# Patient Record
Sex: Female | Born: 1970 | Race: Black or African American | Hispanic: No | Marital: Single | Smoking: Never smoker
Health system: Southern US, Community
[De-identification: ages and names within clinical notes are randomized; demographics above are authoritative.]

## PROBLEM LIST (undated history)

## (undated) DIAGNOSIS — D219 Benign neoplasm of connective and other soft tissue, unspecified: Secondary | ICD-10-CM

## (undated) DIAGNOSIS — T7840XA Allergy, unspecified, initial encounter: Secondary | ICD-10-CM

## (undated) DIAGNOSIS — D649 Anemia, unspecified: Secondary | ICD-10-CM

## (undated) HISTORY — DX: Anemia, unspecified: D64.9

## (undated) HISTORY — PX: COLONOSCOPY: SHX174

## (undated) HISTORY — PX: TONSILLECTOMY: SUR1361

## (undated) HISTORY — DX: Allergy, unspecified, initial encounter: T78.40XA

---

## 2007-09-13 ENCOUNTER — Ambulatory Visit (HOSPITAL_COMMUNITY): Admission: RE | Admit: 2007-09-13 | Discharge: 2007-09-13 | Payer: Self-pay | Admitting: Family Medicine

## 2007-10-29 ENCOUNTER — Inpatient Hospital Stay (HOSPITAL_COMMUNITY): Admission: AD | Admit: 2007-10-29 | Discharge: 2007-10-31 | Payer: Self-pay | Admitting: Obstetrics & Gynecology

## 2007-10-29 ENCOUNTER — Encounter (INDEPENDENT_AMBULATORY_CARE_PROVIDER_SITE_OTHER): Payer: Self-pay | Admitting: Emergency Medicine

## 2007-10-31 ENCOUNTER — Encounter (INDEPENDENT_AMBULATORY_CARE_PROVIDER_SITE_OTHER): Payer: Self-pay | Admitting: Obstetrics and Gynecology

## 2008-01-24 ENCOUNTER — Emergency Department (HOSPITAL_COMMUNITY): Admission: EM | Admit: 2008-01-24 | Discharge: 2008-01-24 | Payer: Self-pay | Admitting: Emergency Medicine

## 2008-05-05 ENCOUNTER — Inpatient Hospital Stay (HOSPITAL_COMMUNITY): Admission: AD | Admit: 2008-05-05 | Discharge: 2008-05-08 | Payer: Self-pay | Admitting: Obstetrics and Gynecology

## 2008-09-14 ENCOUNTER — Inpatient Hospital Stay (HOSPITAL_COMMUNITY): Admission: AD | Admit: 2008-09-14 | Discharge: 2008-09-16 | Payer: Self-pay | Admitting: Obstetrics and Gynecology

## 2009-09-29 IMAGING — US US PELVIS COMPLETE
1 series · 13 of 25 positions shown · non-contrast
Comparison: OB ultrasound 09/13/2007

CLINICAL DATA: Half from the liver and.  Bleeding/evaluate for
retained products of conception or for retained placenta.

TRANSABDOMINAL ULTRASOUND OF PELVIS
TECHNIQUE: Transabdominal ultrasound examination of the pelvis was
performed including evaluation of the uterus, ovaries, adnexal
regions, and pelvic cul-de-sac.

[Series 1: unknown · 0.35mm/px · 13 of 42 slices shown]
[im 1/42]
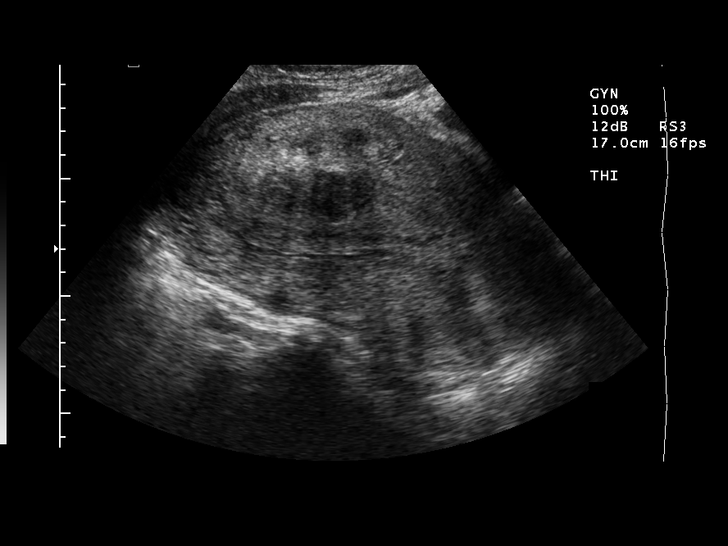
[im 4/42]
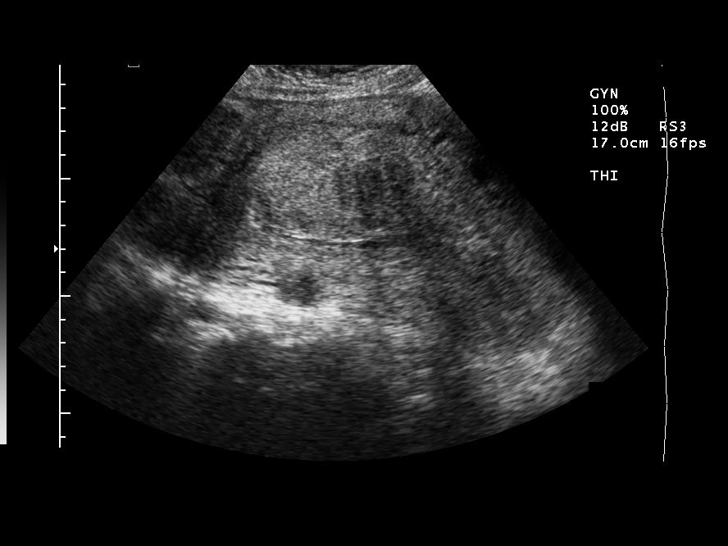
[im 7/42]
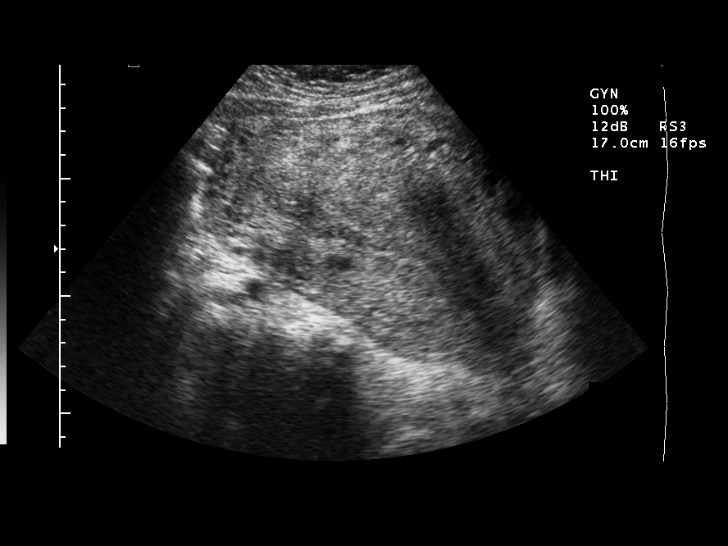
[im 11/42]
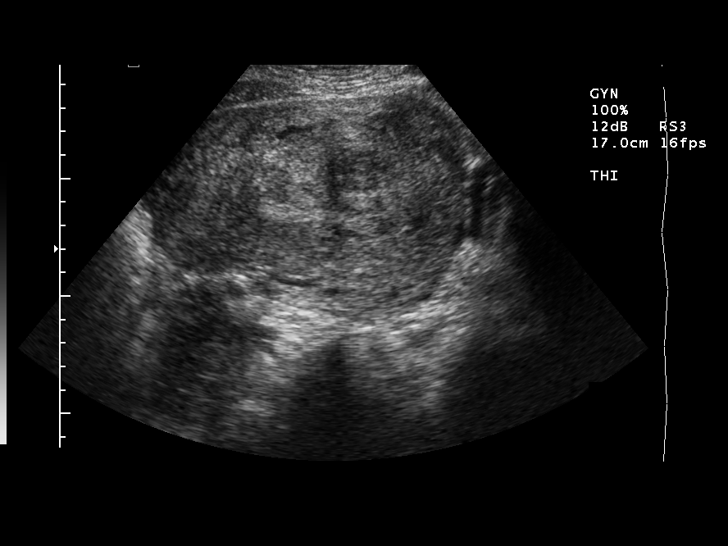
[im 14/42]
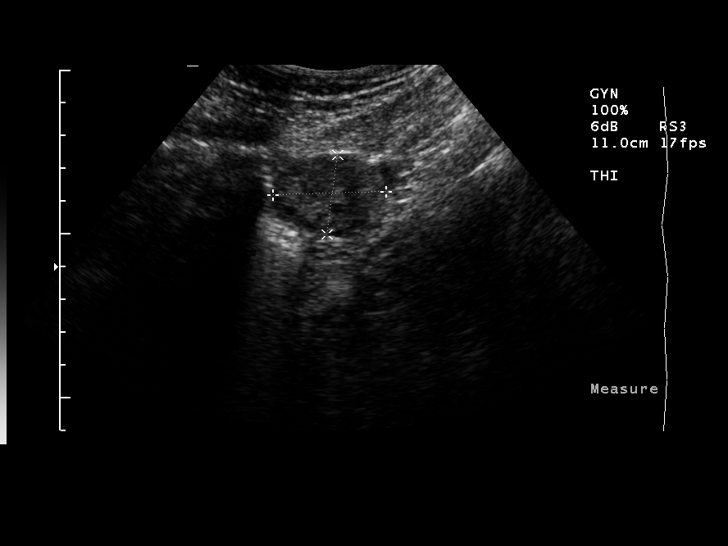
[im 18/42]
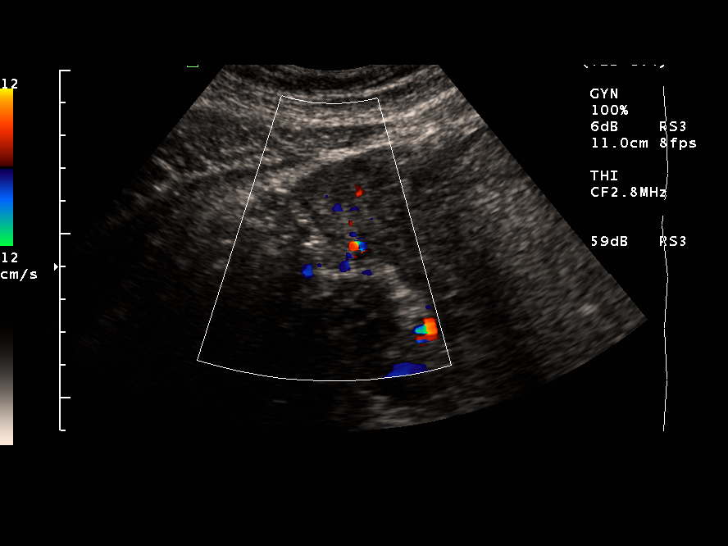
[im 21/42]
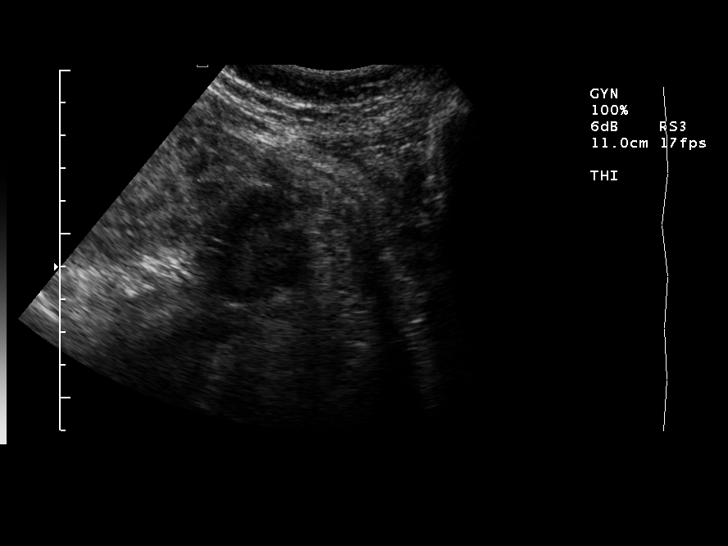
[im 24/42]
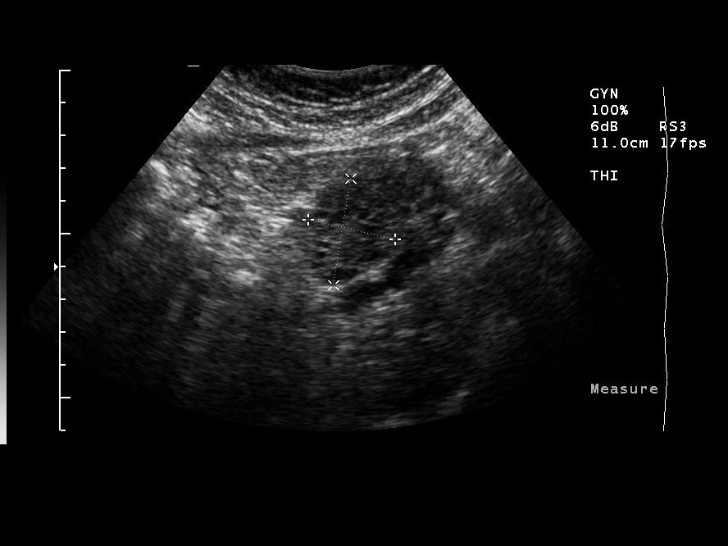
[im 28/42]
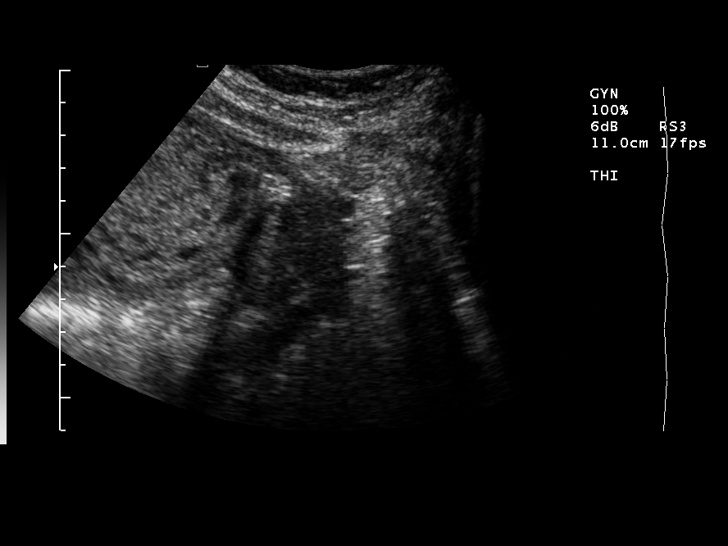
[im 31/42]
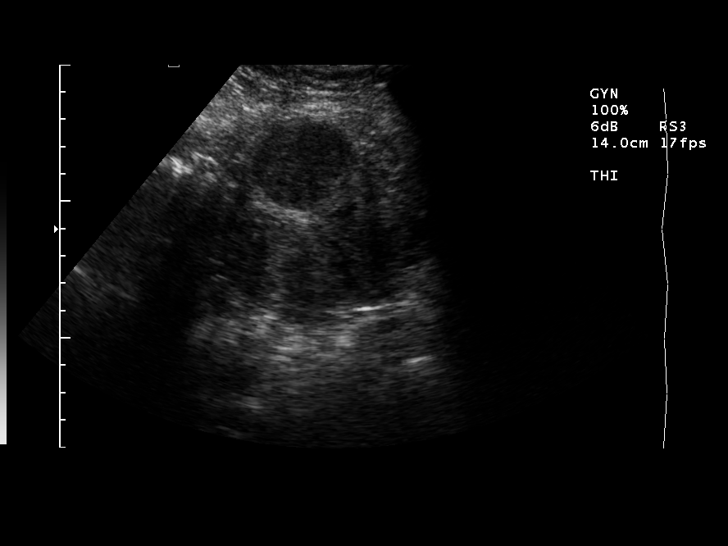
[im 35/42]
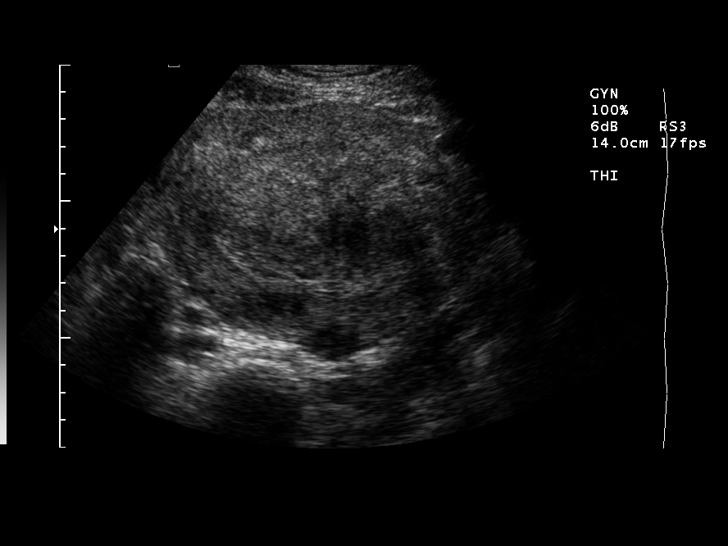
[im 38/42]
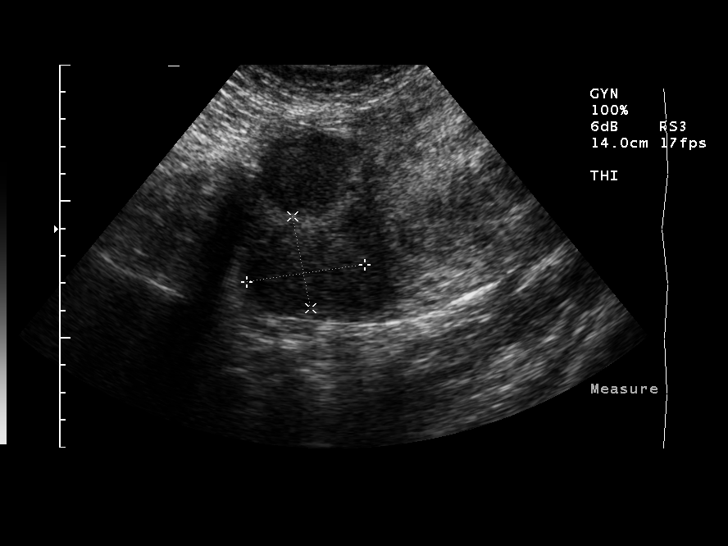
[im 42/42]
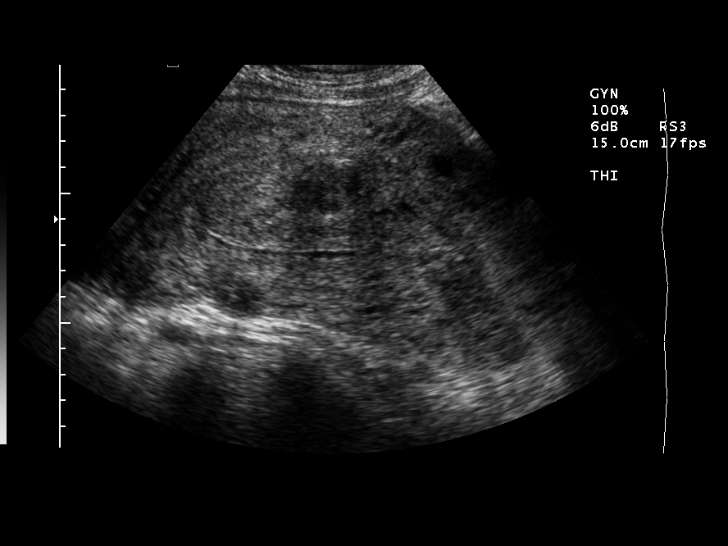

[13 of 25 positions shown; findings below may reference images not displayed]

FINDINGS: The uterus is enlarged as would be expected in the
immediate postpartum..  It measures 21.0 cm x 9.6 cm x 11.8 cm
(length x AP x width) however, in addition to being enlarged, it is
markedly heterogeneous with multiple rounded lesions consistent
with fibroids.  The endometrium can not be distinctly identified,
possibly due to the presence of fibroid disease. Since the
endometrium is not distinctly visible, retained products of
conception or retained placenta cannot be excluded.  Within the
central uterus, there is a 3.7 x 8.2 x 3.8 cm.  hypoechoic
structure. This could be an endometrial clot, but could also be a
submucosal fibroid.

The ovaries are unremarkable.  There is no free pelvic fluid.

This case was reviewed with our [REDACTED]  radiologist, who
concurs with the above report.
IMPRESSION: 1.  Markedly enlarged, heterogeneous uterus with multiple fibroids
identified.
2.  The endometrium is not clearly delineated.  There is a hypo
echoic lesion within the central uterus that could be clot in the
endometrium or a submucosal fibroid.
3.  Because the endometrium is not definitively identified,
retained placenta or retained products of conception cannot be
excluded.

## 2010-02-07 ENCOUNTER — Encounter: Payer: Self-pay | Admitting: Emergency Medicine

## 2010-02-07 ENCOUNTER — Encounter: Payer: Self-pay | Admitting: Obstetrics and Gynecology

## 2010-04-24 LAB — CBC
HCT: 35.2 % — ABNORMAL LOW (ref 36.0–46.0)
MCHC: 33.2 g/dL (ref 30.0–36.0)
Platelets: 201 10*3/uL (ref 150–400)
Platelets: 213 10*3/uL (ref 150–400)
RBC: 3.46 MIL/uL — ABNORMAL LOW (ref 3.87–5.11)
RDW: 13.7 % (ref 11.5–15.5)
RDW: 13.9 % (ref 11.5–15.5)

## 2010-04-28 LAB — CBC
HCT: 36.7 % (ref 36.0–46.0)
Hemoglobin: 12.3 g/dL (ref 12.0–15.0)
MCHC: 33.4 g/dL (ref 30.0–36.0)
MCV: 88.6 fL (ref 78.0–100.0)
RDW: 14.4 % (ref 11.5–15.5)

## 2010-04-28 LAB — URINALYSIS, ROUTINE W REFLEX MICROSCOPIC
Glucose, UA: NEGATIVE mg/dL
Nitrite: NEGATIVE
Protein, ur: NEGATIVE mg/dL
Urobilinogen, UA: 0.2 mg/dL (ref 0.0–1.0)

## 2010-04-28 LAB — DIFFERENTIAL
Basophils Absolute: 0 10*3/uL (ref 0.0–0.1)
Basophils Relative: 0 % (ref 0–1)
Eosinophils Relative: 2 % (ref 0–5)
Monocytes Absolute: 0.2 10*3/uL (ref 0.1–1.0)
Monocytes Relative: 3 % (ref 3–12)

## 2010-04-28 LAB — URINE CULTURE

## 2010-06-01 NOTE — Discharge Summary (Signed)
Ashley Singleton, Ashley Singleton                ACCOUNT NO.:  [id number]   MEDICAL RECORD NO.:  [id number]          PATIENT TYPE:  INP   LOCATION:  9156                          FACILITY:  [facility]   PHYSICIAN:  Janine Limbo, M.D.DATE OF BIRTH:  1970/06/12   DATE OF ADMISSION:  05/05/2008  DATE OF DISCHARGE:  05/08/2008                               DISCHARGE SUMMARY   ADMITTING DIAGNOSES:  1. Intrauterine pregnancy at 21-1/7 weeks.  2. Cervical shortening on ultrasound.  3. History of previous preterm delivery in 2007-11-19.   DISCHARGE DIAGNOSES:  1. 21-4/7 weeks.  2. Slight cervical shortening, but greater than 2.5 cm.   PROCEDURES:  None.   HOSPITAL COURSE:  [name] is a 40 year old gravida 2, para 0-1-0-0,  who was admitted on the afternoon of May 05, 2008, from the office at  21-1/7 weeks secondary to shortening cervix on ultrasound.  The  patient's history was remarkable for a preterm delivery at 27 weeks and  subsequent neonatal death in 11-19-2007.  She was sent to the hospital  for evaluation for possible need for cerclage.  History has been  remarkable for;  1. Advanced maternal age.  2. Increased BMI.  3. History of preterm delivery and neonatal death in 11-19-2007.  4. On 17P weekly injections.   On admission, vital signs were stable.  Fetal heart rate was in the  150s.  Ultrasound from the office showed cervical shortening of 2.93 cm  with funneling and dilation of 1.18 cm.  In supine position, the cervix  length was 3.08 cm with a dilation of 1.12 cm.  The patient was placed  on bedrest.  Urine culture was done.  MFM consult was obtained.  Cultures of the cervix were done.  Per Dr. Carmin Muskrat discussion, there was  not clear evidence to proceed with a cerclage, but she did recommend  progesterone therapy and close cervical length surveillance.  The  patient had no contractions.  On May 08, 2008, the patient was seen by  Dr. Stefano Gaul.  She did wish to go home.   She was having no contractions.  Fetal heart rate was stable.  GC, Chlamydia, beta strep and urine  culture were all negative.  The abdomen was soft and nontender.  She had  an ultrasound showing breech presentation.  Cervix was 2.57 cm long and  normal fluid.  She was deemed to have received full benefit of her  hospital stay.  She received a glycerine suppository with good result  before she left and she was discharged home in stable condition.   DISCHARGE MEDICATIONS:  1. Motrin 800 mg p.o. q.8 h., p.r.n. cramping.  2. Prometrium 200 mg 1 tablet per vagina daily.  3. Prenatal vitamin 1 p.o. daily.   DISCHARGE INSTRUCTIONS:  The patient will maintain bedrest at home and  pelvic rest.  Discharge follow-up will occur at the office on Monday,  May 13, 2008, for a visit and ultrasound of the cervix.  She is also  to call for any increased cramping, bleeding or any other issues.  [name], C.N.M.      Janine Limbo, M.D.  Electronically Signed    VLL/MEDQ  D:  05/08/2008  T:  05/08/2008  Job:  [id number]

## 2010-06-01 NOTE — Discharge Summary (Signed)
Ashley Singleton, Ashley Singleton                ACCOUNT NO.:  [id number]   MEDICAL RECORD NO.:  [id number]          PATIENT TYPE:  INP   LOCATION:  9309                          FACILITY:  [facility]   PHYSICIAN:  Crist Fat. Rivard, M.D. DATE OF BIRTH:  [birth year]   DATE OF ADMISSION:  10/29/2007  DATE OF DISCHARGE:  10/31/2007                               DISCHARGE SUMMARY   ADMITTING DIAGNOSES:  1. 27-6/7 weeks.  2. Preterm delivery precipitously at home.   DISCHARGE DIAGNOSES:  1. 27-6/7 weeks.  2. Preterm delivery precipitously at home.   PROCEDURES:  1. Precipitous unattended preterm delivery of a breech infant at 97-      6/7 weeks.   HOSPITAL COURSE:  Ashley Singleton is a 40 year old gravida 1, para 0, at 41-  6/7 weeks who had an unintended precipitous delivery of a breech infant  at home on October 29, 2007, at approximately 9:00 a.m. the morning of  October 29, 2007.  The patient was transported to [facility] at  which time the infant required resuscitation.  The patient at home was  attended by her mother.  The patient reported onset of pain  approximately at 4:30 a.m. on the morning of the October 29, 2007.  From  4:30 to 9, she used the bathroom with bowel movement and voided multiple  times and passed the baby and placenta at the same time approximately at  9:00 a.m.  The patient's grandmother was called into the room.  She  retrieved the baby from the toilet.  Fetal movement was noted.  She  wrapped the baby in a towel and called 911.  At the time of admission to  [facility], the infant had required full resuscitation efforts and it  was taken to NICU and it is currently in the respirator.  The patient  had limited care at [facility] with only one visit at 24-3/7 weeks on  October 05, 2007.  Previous care had been at the health department  with a second trimester sonogram at [facility], normal anatomy,  normal cervical length, and normal quadruple screen.   On arrival to  [facility], the patient was noted to have no pain,  small amount of bleeding.  Her labs showed a hemoglobin of 11, white  blood cell count of 18.2 with differential shift to the left of 95%.  Coags were normal.  CMET was normal except for an elevated glucose of  126.  Urinalysis showed 15 ketones and 250 mg of glucose.  She was  starting to pump for breast milk.  She did have a number of visits to  NICU on postpartum day #1.  On day #1 postpartum, her hemoglobin was 10,  white blood cell count was 17.4, and platelet count was 221.  By  postpartum day #2, the patient was doing well.  She had seen the baby  multiple times on day #1.  She reported that the infant was alive.  This seemed to reflect her awareness of the baby's critical condition in  a realistic perspective on the infant's fragile nature.  The patient was  denying a postpartum depression.  She would have a fair to light affect  and she was pumping.  She was ready to go home and plan to do this late  in the afternoon of October 29, 2007.  Vital signs were stable.  She was  afebrile.  Her physical exam was within normal limits.  Her fundus was  firm.  Lochia was scant.  Perineum was clear.  She was deemed to receive  full benefit of hospital stay and was discharged home.  Discharge  instructions per [facility] handout.   DISCHARGE MEDICATIONS:  1. Motrin 600 mg p.o. q.6 h. p.r.n. pain.  2. Percocet one to two p.o. q. 2-4 h. p.r.n. pain.   The patient was currently undecided about contraception.  Discharge  followup will occur in 4-6 weeks in [facility] or p.r.n.  Support to the patient was provided for her birth experience and infant  condition.      [name], C.N.M.      Crist Fat Rivard, M.D.  Electronically Signed    VLL/MEDQ  D:  10/31/2007  T:  10/31/2007  Job:  [id number]

## 2010-06-01 NOTE — H&P (Signed)
NAME:  Ashley Singleton, Ashley Singleton                ACCOUNT NO.:  [id number]   MEDICAL RECORD NO.:  [id number]          PATIENT TYPE:  INP   LOCATION:  9164                          FACILITY:  [facility]   PHYSICIAN:  Naima A. Dillard, M.D. DATE OF BIRTH:  [birth year]   DATE OF ADMISSION:  09/14/2008  DATE OF DISCHARGE:                              HISTORY & PHYSICAL   Ashley Singleton is a 40 year old gravida 2, para 0-1-0-0 at 39-6/7 weeks who  presents for induction secondary to polyhydramnios.  She denies any  significant uterine contractions and reports positive fetal movement.  She denies any leaking or bleeding.  [name] has been  remarkable for:  1. History of a 27-week unattended delivery at home in October 2009      with limited prenatal care prior to that time of 1 visit.  2. Polyhydramnios.  3. Advanced maternal age with normal genetic screening but amnio      declined.  4. Early cervical shortening with an incompetent cervix noted at 21      weeks, this was managed with bedrest.  She also received 17P also      in light of her 27-week delivery and was on vaginal progesterone.  5. Group B Streptococcus negative.   PRENATAL LABS:  Blood type is AB positive, Rh antibody negative, VDRL  nonreactive, L titer positive, hepatitis B surface antigen negative.  Pap was normal in August 2009.  GC, Chlamydia cultures were negative in  February.  Pap was also normal in February.  First trimester screen was  normal, AFP was normal.  The patient did have some glycosuria.  She had  a normal 1-hour Glucola.  RPR was nonreactive.  Sickle cell test was  negative.  Hemoglobin was 11.9 at 27 weeks.  She had a fasting blood  sugar done at 30 weeks which was normal.  Group B Streptococcus culture  was negative at 35 weeks.  The patient had also had other cultures done  at approximately 21 weeks at the time of cervical shortening.   HISTORY OF PRESENT PREGNANCY:  The patient entered care at  approximately  12 weeks.  She was begun on 17P at 16 weeks.  Pap, GC, Chlamydia  cultures were done at the first visit and were negative.  Ultrasound for  cervical length was done at 16 weeks, cervix was 5 cm long.  First  trimester screen and AFP were normal.  She had another ultrasound in 19  weeks showing normal growth and development, cervical length was 4.6 cm  at that time.  She had another ultrasound at 21 weeks showing cervical  length of 2.78 with funneling and was dilated at 1.8 cm.  Cervix was  closed but shortened on pelvic exam.  GC, Chlamydia and group B  Streptococcus were done at that time which were all negative.  She was  admitted to the hospital at 21 weeks to rule out contractions.  She was  in the hospital from April 19 to April 22.  She had another ultrasound  at 22 weeks showing normal cervical length of 3.27.  She was on vaginal  Prometrium 200 mg daily at that time.  At 23 weeks cervical length was  2.1 cm.  Plan was made at that time to repeat her cervical length at the  next visit and if it was less than 2 she would be placed back in the  hospital.  She did have some glycosuria and did show some slightly  elevated Dextrostix, however her 1 hour Glucola was normal.  Cervical  length at 24 weeks was 2.56 cm.  She had another ultrasound at 25 weeks  showing growth at the 69th percentile, fluid level was normal and  cervical length was 2.60 without funneling.  At 26 weeks cervix was  closed and long by digital exam.  At 28 weeks her estimated fetal weight  was at the 75th percentile, AFI was at the greater than 97th percentile  with an AFI of 225.9, the fetus was vertex at that time and estimated  fetal weight was 2 pounds 10 ounces.  Cervix was long and closed to  digital exam at that time.  At 29 weeks she still had some glycosuria  and had a Dextrostix of 157.  A plan was made to do a fasting blood  sugar the next week and it was within normal limits at 87.  She  also had  an ultrasound at that time showing an estimated fetal weight at the 51st  percentile and still elevated fluid.  The cervix was 2.9 cm at that  stage.  At 31 weeks she was still using the progesterone vaginally,  cervix was 2.67 cm, amniotic fluid index was at the 97th percentile with  226.8 and her BPP was 8 out of 8.  Through the rest of the course of her  hospitalization her cervical length remained stable.  AFI on last exam  at 35 weeks, the last exam that was noted in her chart, was at 32.  BPP  was 8 out of 8 and estimated fetal weight was 5 pounds 14 ounces.  Progesterone was stopped at 35 weeks.  The rest of her pregnancy has  been essentially uncomplicated.   OBSTETRICAL HISTORY:  In October 2009 she had a precipitous unattended  vaginal delivery at home of a 27-6/7 week female weighing 2 pounds 2  ounces.  She had one prenatal visit prior to that time.  The baby did  pass away in the neonatal period secondary to neurological issues.  The  patient did suffer from some depression subsequent to that, the baby's  name was Ashley Singleton.   MEDICAL HISTORY:  The patient had oral contraceptive use in the past.  She reports usual childhood illnesses.  Her only surgery was tonsils  removed in 1990.  She has no known medication allergies.   FAMILY HISTORY:  Her mother is hypertensive, on medication.  Her  maternal grandmother had some type of blood disorder.   GENETIC HISTORY:  Is remarkable for the patient's age of 75.   SOCIAL HISTORY:  The patient is single and the father of baby has been  involved and supportive, his name is Jonna Coup.  The patient is  Tree surgeon.  She denies a religious affiliation.  She has an  associates degree and is employed in [employer] services at a [employer]  part-time.  Her partner has a high school diploma, he is employed at a  Programme researcher, broadcasting/film/video.  She has been followed by the physician  service [facility].  She denies any  alcohol, drug or tobacco  use during this pregnancy.   PHYSICAL EXAMINATION:  VITAL SIGNS:  Stable.  The patient is febrile.  HEENT:  Within normal limits.  LUNGS:  Her breath sounds are clear.  HEART:  Regular rate and rhythm without murmur.  BREASTS:  Soft and nontender.  ABDOMEN:  Fundal height is approximately 38 cm, estimated fetal weight  is 7 to 7.5 pounds.  Uterine contractions are very occasional and mild.  Fetal heart rate is reactive at present.  PELVIC EXAM:  Is 1-2, 70% vertex with the vertex being slightly high per  the R.N. exam.  EXTREMITIES:  Deep tendon reflexes are 2+ without clonus.  There is a  trace edema noted.  The patient is having very occasional contractions.   IMPRESSION:  1. Intrauterine pregnancy at thirty-nine and six-sevenths weeks.  2. Polyhydramnios.  3. Group B Streptococcus negative.  4. Previous 27-week neonatal death.   PLAN:  1. Admit to birthing suite per consult with Dr. Normand Sloop as attending      physician.  2. Routine physician orders.  3. Plan Pitocin per low-dose protocol for labor induction.  4. Pain meds p.r.n.  5. MDs will follow.      [name], C.N.M.      Naima A. Normand Sloop, M.D.  Electronically Signed    VLL/MEDQ  D:  09/14/2008  T:  09/14/2008  Job:  [id number]

## 2010-06-01 NOTE — H&P (Signed)
Ashley Singleton, Ashley Singleton                ACCOUNT NO.:  [id number]   MEDICAL RECORD NO.:  [id number]          PATIENT TYPE:  INP   LOCATION:  9309                          F[facility]ILITY:  [facility]   PHYSICIAN:  Crist Fat. Rivard, M.D. DATE OF BIRTH:  01/13/1971   DATE OF ADMISSION:  10/29/2007  DATE OF DISCHARGE:  09/13/2007                              HISTORY & PHYSICAL   Ashley Singleton is a 40 year old single black female, primigravida at 27-6/[redacted]  weeks gestation.  Current EDC, which was based on a LMP of April 18, 2007, [facility].  She presents on the day of  admission status post a breech spontaneous vaginal delivery preterm at  home.  She was transferred by [facility] from [facility] ER  after the breech home birth.  The female baby was born at home in toilet,  and was named Education administrator.  He required resuscitation with epinephrine x4,  and is now here in the NICU and stable on respirator.  He battled with  acidosis for at least 2 hours after birth, and now has normal pH.  Some  lower extremity bruising from his descent with breech delivery.  The  patient had reported awakening around 4 a.m., with complaints of nausea  and vomiting -- which she thought may be morning sickness, but also  having some cramping and diarrhea.  It persisted up until the delivery  of the infant, somewhere around 9 a.m. (per patient recall).  She was  alone in the bathroom.  Her grandmother was called; she called her into  bathroom and she reported seeing one of the baby's legs coming out of  the vagina.   The patient has had limited care.  She entered care at [facility] on September 12, 2007, and being subsequently transferred  on October 05, 2007 to [place] with only that one visit at 24-  3/7 weeks.  She had a second trimester sonogram at [facility], that showed normal anatomy; a cervical length of 3.2 cm.  They did have difficulty viewing  aortic arch, and she had a follow-up  ultrasound scheduled at our office to try to and observe that better.  She did have a normal quad screen.  She did report passing both baby and  placenta around the same time.  After the baby was born the grandmother  retrieved the baby from the commode.  She did note fetal movement; she  wrapped the baby in a towel and subsequently called 9-1-1.   Other history to note for the patient -- She denied any febrile illness  prior to today's events.  She is currently feeling well, does not have  any pain.  Her bleeding is normal.  She had several numerous questions  about the baby.   PRENATAL LABS:  Blood Type AB-positive.  On September 12, 2007 her  hemoglobin was 11.5 and hematocrit 36.5, platelets were 280.  Her  antibody screen was negative.  Serology was negative.  Hepatitis surface  antigen negative.  We do not  have a Rubella result.  Sickle cell  negative.  Varicella is immune.  HIV was nonreactive.  Gonorrhea and  chlamydia cultures on September 12, 2007 were both negative.  The Pap smear  was negative that day as well.  A 1-hour GTT on that date was within  normal limits, equal to 92.  As I mentioned, her quad screen was  negative.   SOCIAL HISTORY:  The patient is a single black female.  She is of  [employer].  The father of the baby's name is [name] and  the patient reports that they are in a relationship; he is involved and  supportive.  She has had a 12th grade education.  She works as a Haematologist.  She denied any alcohol, tobacco or illicit drug use.   ALLERGIES:  NO KNOWN DRUG ALLERGIES.   MEDICATIONS:  Prenatal vitamin, one tablet p.o. daily.   PAST MEDICAL HISTORY:  She reports not being certain of her LMP.  She  did have monthly cycles from heavy flow.  Condoms in the past for  contraception.  Also reported pregnancy test August 16, 2007.  Pregnancy  unplanned.   OBSTETRICAL HISTORY:  She is a primigravida.    GENETICS:  Genetic screen remarkable for a patient older than 35,  otherwise within normal limits.   CONTINUED PAST MEDICAL HISTORY:  The patient does report tonsillectomy  in 1990.  Otherwise unremarkable.   FAMILY HISTORY:  Remarkable for mother with chronic hypertension and on  medications.  Maternal aunt with varicose veins.  Maternal grandmother  with questionable blood disorder, myeloma.   HISTORY OF PRESENT PREGNANCY:  Please see HPI, as well as the following:  The patient entered care at [facility] for a new OB interview October 02, 2007, and was approximately 24 weeks.   She returned on October 05, 2007 at 24-3/7 weeks; blood pressure at  that time was 120/90, her repeat was 110/80.  Weight 185.  She reported  fetal movement.  She was measuring 24 weeks fundal height.  The  patient's pre gravid weight was somewhere around 200, she reported.  She  is 5 foot 5 inches.  She stated on that day that she desired transfer  for M.D. care from [facility].  It was noted on  her chart at the time some possible mental slowness and slow learner.  She reported the other day that she had fallen down some steps about 2-3  months prior to that date, so that would put back somewhere in June or  July.  She reported some occasional headaches.  As was mentioned, that  was the only visit she had at our office.  This too, place here,  ultrasound was done September 13, 2007 at [facility]  with a detailed greater than 14-week ultrasound for anatomy; uncertain  LMP as well as advanced maternal age.  The placenta was anterior,  cephalic presentation at that time; single uterine pregnancy.  AFI was  within normal limits.  Per LMP she would have been 21 weeks and one day  at that time, and ultrasound today was 20 weeks and 2 days measurement.   OBJECTIVE:  VITAL SIGNS:  (at admission)  These have been stable.  She  is afebrile.  Blood pressure on admission 102/66,  heart rate 87,  temperature 98.2.  She has lab work from this morning at 10 a.m.; her  white count was elevated at 18.2, hemoglobin 11, hematocrit  33.8,  platelet count 221.  The differential showed neutrophil percentile was  high (95).  Lymphocytes were low, equal to 2.  Monocytes were also low,  equal to 2.  Absolute neutrophils were high (17.4).  She did have  coagulation studies and her Prothrombin Time was 14.4, within normal  limits.  INR was 1.1.  It looked like she had a Complete Metabolic  Panel; it was within normal limits, with the exception of glucose  slightly high equal to 126.  They did send a urine, it was within normal  limits with the exception of glucosuria was 250; she had high 15 of  ketones.  No other labs have been ordered, with the exception of routine  postpartum labs and the addition of a Rubella titer.   PHYSICAL EXAMINATION:  GENERAL:  No acute distress.  She was alert and  oriented x3.  HEENT:  Grossly intact within normal limits.  She does have glasses,  which she wears all the time.  CARDIOVASCULAR:  Regular rate and rhythm, without murmur.  LUNGS:  Clear to auscultation bilaterally.  ABDOMEN:  Now soft.  It is nontender.  The uterus is firm at umbilicus.  GU:  She does have some palpable fibroids, which she denied any previous  history of knowing of those.  The perineum is intact.  She does have  lochia rubra, just small amounts; there were no clots noted.  EXTREMITIES:  Within normal limits.  Negative Homan sign.   IMPRESSION:  1. Stable, status post a preterm breech delivery at 27-6/7 weeks of a      female viable infant; who is down in the NICU.  2. Advanced maternal age.  3. Rh-positive.  4. Questionable mental slowness.   PLAN:  1. Admitted to [facility], with Dr. Estanislado Pandy as      attending physician.  She was at bedside to see the patient and      take history and physical.  2. Routine postpartum orders.  3. Support as  needed.  We will continue to observe, with possible discharge tomorrow October 30, 2007.      Candice [place], [place]      ______________________________  Crist Fat Rivard, M.D.    CHS/MEDQ  D:  10/29/2007  T:  10/29/2007  Job:  [id number]

## 2010-06-01 NOTE — H&P (Signed)
[name], [name]                ACCOUNT NO.:  [id number]   MEDICAL RECORD NO.:  [id number]          PATIENT TYPE:  INP   LOCATION:  9156                          [facility]  [facility]   PHYSICIAN:  Janine Limbo, M.D.DATE OF BIRTH:  1971-01-05   DATE OF ADMISSION:  05/05/2008  DATE OF DISCHARGE:                              HISTORY & PHYSICAL   [name] is a 40 year old, gravida 2, para 0-1-0-0 who is followed by  the doctors at [facility] and was sent from the office  today for concerns over a possibly shortening cervix and a history of  preterm delivery at 27 weeks last year with a subsequent neonatal death.  Her pregnancy is remarkable for:  1. Advanced maternal age.  2. Increased BMI.  3. History of preterm delivery and neonatal death.  4. 17p weekly injections.   PRENATAL LABS:  Ashley Singleton is missing her prenatal labs.  All we have is  blood type AB positive so we are going to be drawing those today.   MEDICATIONS:  Her current medications include prenatal vitamins daily  and no other medications aside from the weekly 17p injections.   HISTORY OF PRESENT PREGNANCY:  Ashley Singleton had her new OB exam in  February at approximately 12 weeks' gestation and at 13 weeks she had  some chlamydia and gonorrhea cultures that were negative, declined  amniocentesis, and agreed to have the 17p.  Her first trimester screen  was normal.  At 21 weeks which is today she had an ultrasound which  showed a possible shortening cervix and was sent from the office.   OB HISTORY:  She was pregnant one other time and had preterm delivery at  home at 27 weeks in 11/21/2022 and neonatal death of a son, Hulan Fess, who  weighed 2 pounds 2 ounces and immediately became pregnant again.  This  is pregnancy #2.  She is due September 15, 2008.   ALLERGIES:  SHE HAS NO ALLERGIES TO MEDICATIONS, FOOD, OR LATEX THAT ARE  KNOWN.   MEDICAL HISTORY:  She has a history of preterm delivery.  Use of  contraception in the past, combined oral contraceptive birth control  pills.  The usual childhood illnesses including chickenpox.  No chronic  diseases or ongoing health concerns.   SURGICAL HISTORY:  Includes tonsillectomy in 1990.   FAMILY HISTORY:  Her maternal grandmother has some type of blood  disorder that is not specified.  There is no other contributory family  history.   GENETIC HISTORY:  Patient is advanced maternal age.  She did decline the  amniocentesis.  She did have a normal first trimester screen and AFP;  however, there is no contributory information regarding the genetic  screening of the father of the baby.   SOCIAL HISTORY:  She is single.  Father of the baby is named as Skeet Latch.  Patient is Tree surgeon.  She does not state a religious  preference.  She has an associates degree and works in [employer].  She denies use of alcohol, tobacco, or street drugs during this  pregnancy.   PHYSICAL EXAM:  All normal today.  VITAL SIGNS:  Include temperature 98.2.  Respiratory rate 18.  Pulse 78.  Blood pressure 127/67.  Fetal heart rate is 153 to 163 per Doppler.  Toco shows no contractions and uterus is soft to palpation at the  bedside.  She is alert and oriented x3.  LUNGS:  Clear to auscultation bilaterally.  HEART:  Regular rate and rhythm without murmurs.  She has no edema of  her extremities.  Her deep tendon reflexes are +1 and +1.  Her Homan  sign is negative x2 and she is without any complaints of pressure or  pelvic pain or any unusual symptoms or report of any kind of illness in  recent weeks.  ABDOMEN:  Soft and nontender.   IMPRESSION:  A 40 year old G2, P0-1-0-0, history preterm delivery  neonatal death at 106 weeks.  She is currently 21.1 weeks with possible  cervical shortening.   PLAN:  Evaluate her for possible placement cerclage if indicated.  A  urine has been sent for culture per request of Dr. Pennie Rushing.  She had  chlamydia,  gonorrhea, and beta strep cultures done in the office today  and those are pending.  She is having OB labs, HIV, and sickle screen  done here in the office today and she being admitted under 24-23-hour  observation to begin with for further evaluation.  There is reassuring  fetal status at this point.      Eulogio Bear, CNM      Janine Limbo, M.D.  Electronically Signed    JM/MEDQ  D:  05/05/2008  T:  05/05/2008  Job:  [id number]

## 2010-10-19 LAB — DIFFERENTIAL
Eosinophils Absolute: 0
Eosinophils Relative: 0
Lymphocytes Relative: 2 — ABNORMAL LOW
Lymphs Abs: 0.4 — ABNORMAL LOW
Monocytes Absolute: 0.4
Monocytes Relative: 2 — ABNORMAL LOW

## 2010-10-19 LAB — URINALYSIS, ROUTINE W REFLEX MICROSCOPIC
Bilirubin Urine: NEGATIVE
Glucose, UA: 250 — AB
Hgb urine dipstick: NEGATIVE
Specific Gravity, Urine: 1.025
Urobilinogen, UA: 0.2
pH: 6.5

## 2010-10-19 LAB — COMPREHENSIVE METABOLIC PANEL
ALT: 21
AST: 25
Albumin: 2.8 — ABNORMAL LOW
CO2: 23
Calcium: 8.7
Creatinine, Ser: 0.49
GFR calc Af Amer: >60
GFR calc non Af Amer: >60
Sodium: 135

## 2010-10-19 LAB — CBC
Hemoglobin: 10 — ABNORMAL LOW
MCHC: 33.4
MCHC: 32.7
MCV: 89.8
MCV: 88.7
Platelets: 221
RBC: 3.35 — ABNORMAL LOW
RBC: 3.81 — ABNORMAL LOW
WBC: 17.4 — ABNORMAL HIGH
WBC: 18.2 — ABNORMAL HIGH

## 2010-10-19 LAB — STREP B DNA PROBE: Strep Group B Ag: POSITIVE

## 2010-10-19 LAB — GC/CHLAMYDIA PROBE AMP, GENITAL: GC Probe Amp, Genital: NEGATIVE

## 2011-03-21 ENCOUNTER — Ambulatory Visit: Payer: Self-pay | Admitting: Obstetrics and Gynecology

## 2011-04-05 ENCOUNTER — Ambulatory Visit (INDEPENDENT_AMBULATORY_CARE_PROVIDER_SITE_OTHER): Payer: Self-pay | Admitting: Obstetrics and Gynecology

## 2011-04-05 DIAGNOSIS — Z3041 Encounter for surveillance of contraceptive pills: Secondary | ICD-10-CM

## 2011-04-05 DIAGNOSIS — Z01419 Encounter for gynecological examination (general) (routine) without abnormal findings: Secondary | ICD-10-CM

## 2011-07-05 ENCOUNTER — Encounter (HOSPITAL_BASED_OUTPATIENT_CLINIC_OR_DEPARTMENT_OTHER): Payer: Self-pay | Admitting: *Deleted

## 2011-07-05 ENCOUNTER — Emergency Department (HOSPITAL_BASED_OUTPATIENT_CLINIC_OR_DEPARTMENT_OTHER)
Admission: EM | Admit: 2011-07-05 | Discharge: 2011-07-05 | Disposition: A | Discharge status: Home / Self Care | Payer: Self-pay | Attending: Emergency Medicine | Admitting: Emergency Medicine

## 2011-07-05 ENCOUNTER — Emergency Department (HOSPITAL_BASED_OUTPATIENT_CLINIC_OR_DEPARTMENT_OTHER): Payer: Self-pay

## 2011-07-05 DIAGNOSIS — R0789 Other chest pain: Secondary | ICD-10-CM | POA: Insufficient documentation

## 2011-07-05 DIAGNOSIS — Z91013 Allergy to seafood: Secondary | ICD-10-CM | POA: Insufficient documentation

## 2011-07-05 LAB — DIFFERENTIAL
Eosinophils Absolute: 0.1 10*3/uL (ref 0.0–0.7)
Eosinophils Relative: 2 % (ref 0–5)
Lymphs Abs: 1.4 10*3/uL (ref 0.7–4.0)
Monocytes Relative: 7 % (ref 3–12)

## 2011-07-05 LAB — CBC
Hemoglobin: 11.5 g/dL — ABNORMAL LOW (ref 12.0–15.0)
MCH: 28.6 pg (ref 26.0–34.0)
MCV: 83.1 fL (ref 78.0–100.0)
Platelets: 291 10*3/uL (ref 150–400)
RBC: 4.02 MIL/uL (ref 3.87–5.11)
WBC: 5.4 10*3/uL (ref 4.0–10.5)

## 2011-07-05 LAB — BASIC METABOLIC PANEL
BUN: 8 mg/dL (ref 6–23)
Calcium: 9.4 mg/dL (ref 8.4–10.5)
Creatinine, Ser: 0.6 mg/dL (ref 0.50–1.10)
GFR calc non Af Amer: >90 mL/min (ref >90)
Glucose, Bld: 90 mg/dL (ref 70–99)
Sodium: 139 mEq/L (ref 135–145)

## 2011-07-05 MED ORDER — HYDROCODONE-ACETAMINOPHEN 5-325 MG PO TABS: 2.0000 | ORAL_TABLET | ORAL | Status: AC | PRN

## 2011-07-05 MED ORDER — IBUPROFEN 800 MG PO TABS: 800.0000 mg | ORAL_TABLET | Freq: Three times a day (TID) | ORAL | Status: DC

## 2011-07-05 MED ORDER — IBUPROFEN 800 MG PO TABS: 800.0000 mg | ORAL_TABLET | Freq: Three times a day (TID) | ORAL | Status: AC

## 2011-07-05 NOTE — ED Notes (Signed)
Pt c/o chest pain which started this am denies n/v or SOB, pain increases with movt

## 2011-07-05 NOTE — Discharge Instructions (Signed)
Chest Wall Pain Chest wall pain is pain in or around the bones and muscles of your chest. It may take up to 6 weeks to get better. It may take longer if you must stay physically active in your work and activities.  CAUSES  Chest wall pain may happen on its own. However, it may be caused by:  A viral illness like the flu.   Injury.   Coughing.   Exercise.   Arthritis.   Fibromyalgia.   Shingles.  HOME CARE INSTRUCTIONS   Avoid overtiring physical activity. Try not to strain or perform activities that cause pain. This includes any activities using your chest or your abdominal and side muscles, especially if heavy weights are used.   Put ice on the sore area.   Put ice in a plastic bag.   Place a towel between your skin and the bag.   Leave the ice on for 15 to 20 minutes per hour while awake for the first 2 days.   Only take over-the-counter or prescription medicines for pain, discomfort, or fever as directed by your caregiver.  SEEK IMMEDIATE MEDICAL CARE IF:   Your pain increases, or you are very uncomfortable.   You have a fever.   Your chest pain becomes worse.   You have new, unexplained symptoms.   You have nausea or vomiting.   You feel sweaty or lightheaded.   You have a cough with phlegm (sputum), or you cough up blood.  MAKE SURE YOU:   Understand these instructions.   Will watch your condition.   Will get help right away if you are not doing well or get worse.  Document Released: 01/03/2005 Document Revised: 12/23/2010 Document Reviewed: 08/30/2010 Exit[employer] Patient Information 2012 [employer]. 

## 2011-07-05 NOTE — ED Provider Notes (Signed)
History     CSN: [id number]  Arrival date & time 07/05/11  1628   First MD Initiated Contact with Patient 07/05/11 1642      Chief Complaint  Patient presents with  . Chest Pain    (Consider location/radiation/quality/duration/timing/severity/associated sxs/prior treatment) Patient is a 41 y.o. female presenting with chest pain. The history is provided by the patient. No language interpreter was used.  Chest Pain Chest pain is worsened by certain positions. Pertinent negatives for primary symptoms include no fever, no fatigue, no shortness of breath, no cough, no palpitations and no nausea. She tried nothing for the symptoms. Risk factors include no known risk factors.   Pt complains of pain in the left side of her chest.  Pt reports she turned her head to the side and had pain down her neck into her chest,   Pt complains of continued pain in her chest when she moves.   Pt reports pain is worse with some positions.  History reviewed. No pertinent past medical history.  Past Surgical History  Procedure Date  . Tonsillectomy     History reviewed. No pertinent family history.  History  Substance Use Topics  . Smoking status: Never Smoker   . Smokeless tobacco: Not on file  . Alcohol Use: No    OB History    Grav Para Term Preterm Abortions TAB SAB Ect Mult Living                  Review of Systems  Constitutional: Negative for fever and fatigue.  Respiratory: Negative for cough and shortness of breath.   Cardiovascular: Positive for chest pain. Negative for palpitations.  Gastrointestinal: Negative for nausea.  All other systems reviewed and are negative.    Allergies  Shrimp  Home Medications   Current Outpatient Rx  Name Route Sig Dispense Refill  . ACETAMINOPHEN 325 MG PO TABS Oral Take by mouth every 6 (six) hours as needed. Patient used this medication for her headache.      BP 138/92  Pulse 95  Temp 98.3 F (36.8 C) (Oral)  Resp 16  Ht 5\' 4"   (1.626 m)  Wt 225 lb (102.059 kg)  BMI 38.62 kg/m2  SpO2 100%  LMP 07/03/2011  Physical Exam  Nursing note and vitals reviewed. Constitutional: She is oriented to person, place, and time. She appears well-developed and well-nourished.  HENT:  Head: Normocephalic.  Right Ear: External ear normal.  Left Ear: External ear normal.  Nose: Nose normal.  Mouth/Throat: Oropharynx is clear and moist.  Eyes: Conjunctivae and EOM are normal. Pupils are equal, round, and reactive to light.  Neck: Normal range of motion. Neck supple.  Cardiovascular: Normal rate, regular rhythm and normal heart sounds.   Pulmonary/Chest: Effort normal and breath sounds normal.  Abdominal: Soft. Bowel sounds are normal.  Musculoskeletal: Normal range of motion.  Neurological: She is alert and oriented to person, place, and time. She has normal reflexes.  Skin: Skin is warm.  Psychiatric: She has a normal mood and affect.    ED Course  Procedures (including critical care time)  Labs Reviewed  CBC - Abnormal; Notable for the following:    Hemoglobin 11.5 (*)     HCT 33.4 (*)     All other components within normal limits  DIFFERENTIAL  BASIC METABOLIC PANEL  TROPONIN I  D-DIMER, QUANTITATIVE   Dg Chest 2 View  07/05/2011  *RADIOLOGY REPORT*  Clinical Data: Chest pain.  CHEST - 2 VIEW  Comparison: No priors.  Findings: Lung volumes are normal.  No consolidative airspace disease.  No pleural effusions.  No pneumothorax.  No pulmonary nodule or mass noted.  Pulmonary vasculature and the cardiomediastinal silhouette are within normal limits.  IMPRESSION: 1. No radiographic evidence of acute cardiopulmonary disease.  Original Report Authenticated By: Florencia Reasons, M.D.    Date: 07/05/2011  Rate: 89  Rhythm: normal sinus rhythm  QRS Axis: normal  Intervals: normal  ST/T Wave abnormalities: normal  Conduction Disutrbances:none  Narrative Interpretation:   Old EKG Reviewed: none available   No  diagnosis found.    MDM  Pain sounds muscular.   Pt advised ibuprofen and hydrocodone.   Pt given primary care referrals        Lonia Skinner [place], [place] 07/05/11 1831

## 2011-07-06 NOTE — ED Provider Notes (Signed)
Medical screening examination/treatment/procedure(s) were performed by non-physician practitioner and as supervising physician I was immediately available for consultation/collaboration.   Quintara Bost, MD 07/06/11 2311 

## 2013-06-05 IMAGING — CR DG CHEST 2V
2 series · 2 of 2 positions shown · non-contrast
Comparison: No priors.

CLINICAL DATA: Chest pain.

CHEST - 2 VIEW

[w chest pa]
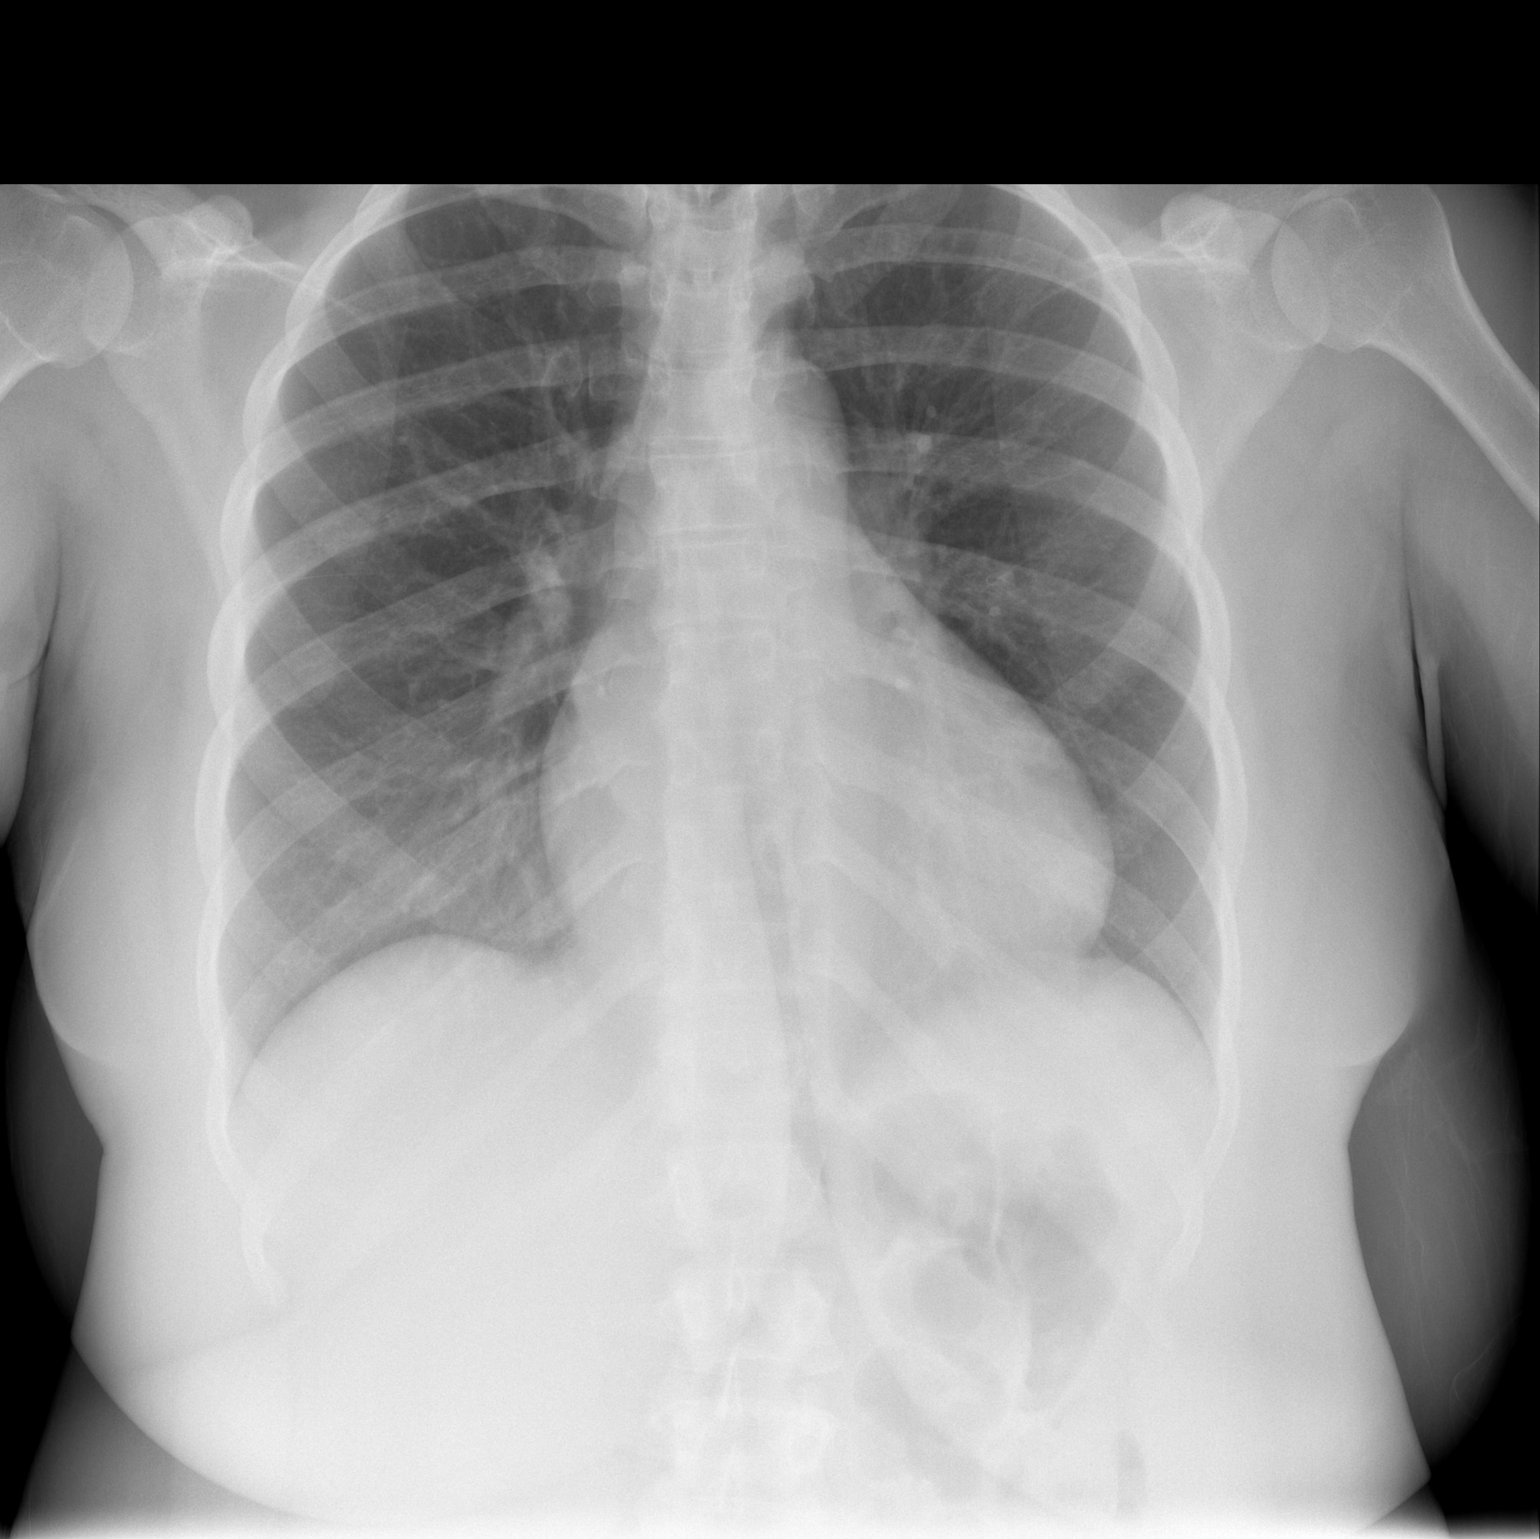

[w chest lat]
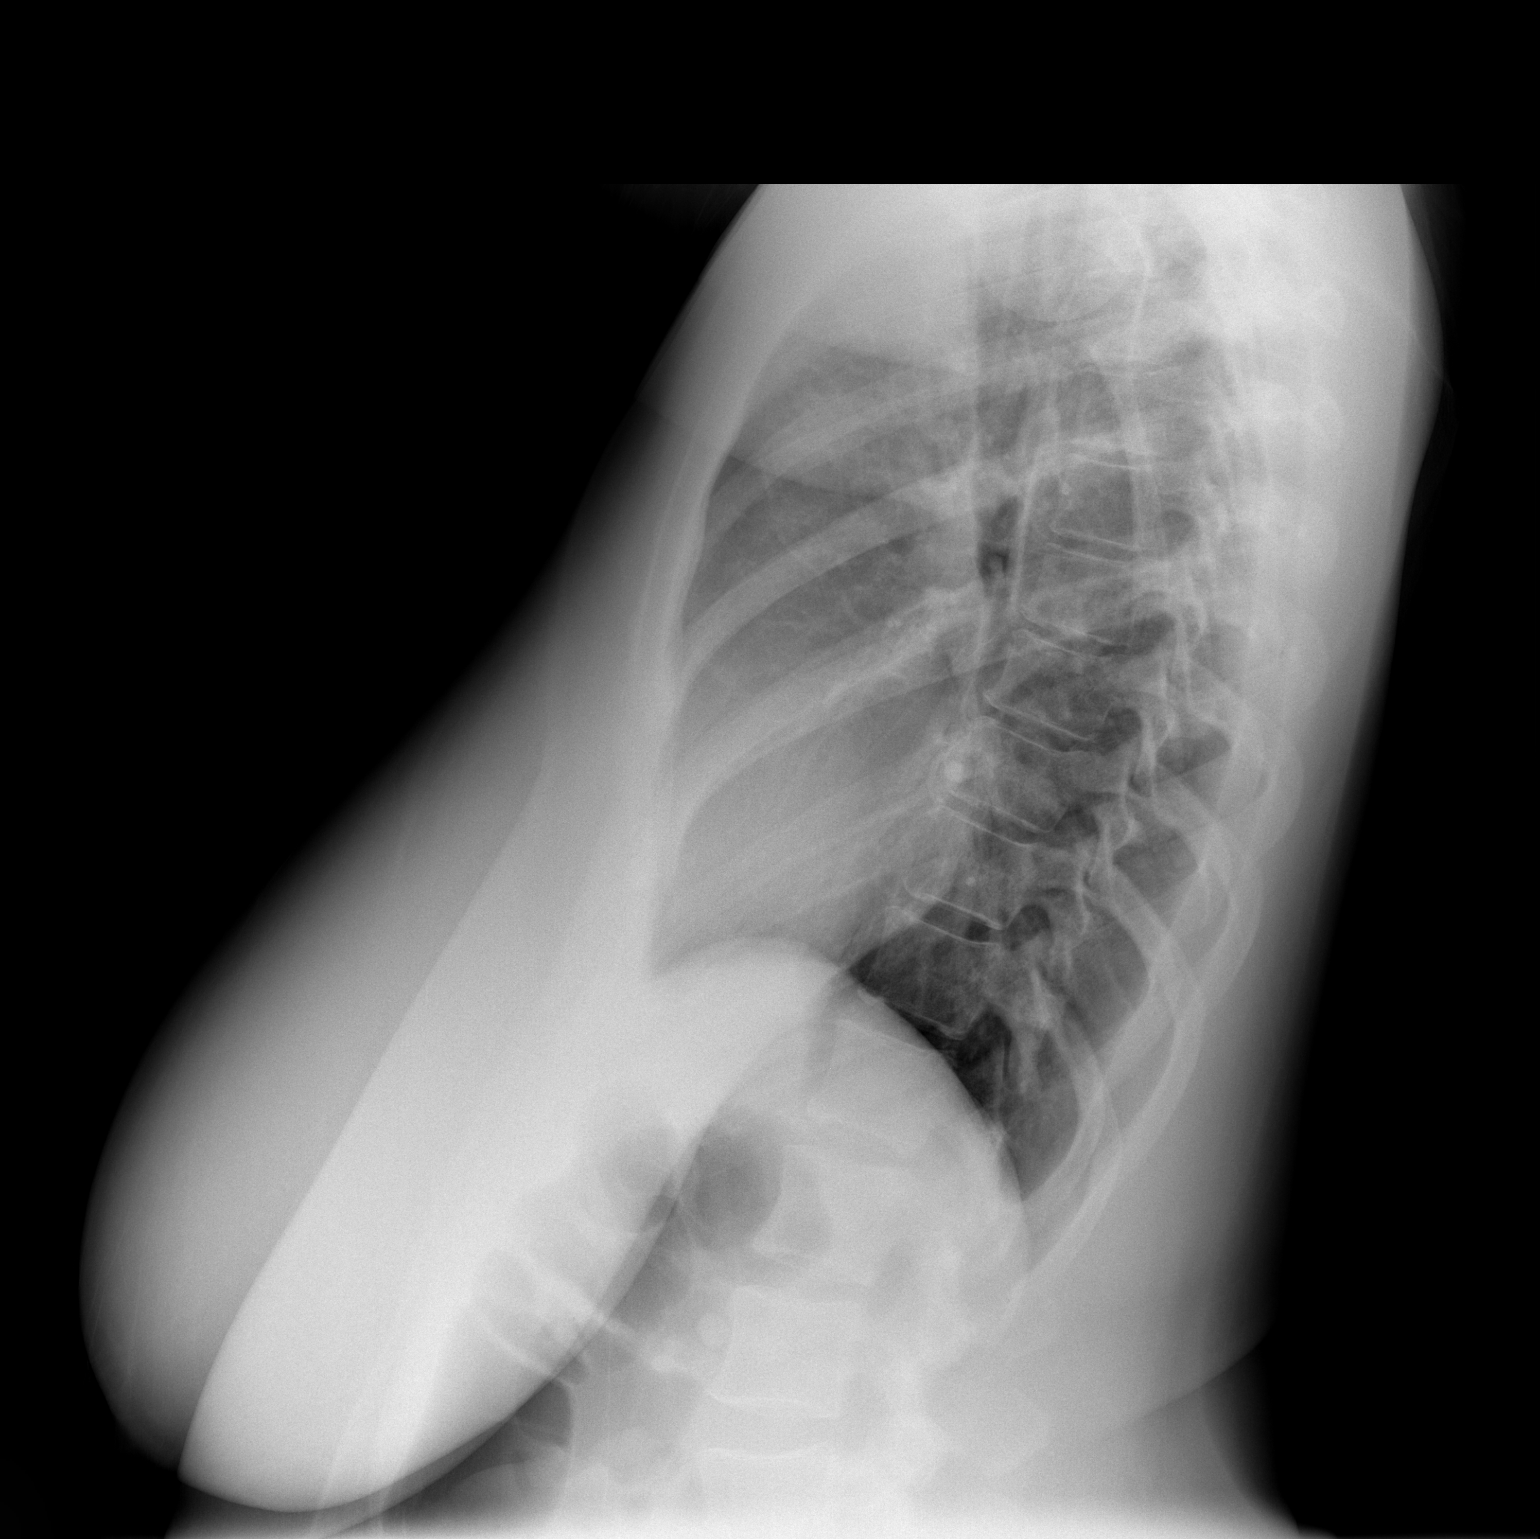

[2 of 2 positions shown; findings below may reference images not displayed]

FINDINGS: Lung volumes are normal.  No consolidative airspace
disease.  No pleural effusions.  No pneumothorax.  No pulmonary
nodule or mass noted.  Pulmonary vasculature and the
cardiomediastinal silhouette are within normal limits.
IMPRESSION: 1. No radiographic evidence of acute cardiopulmonary disease.

## 2014-12-15 ENCOUNTER — Emergency Department (HOSPITAL_BASED_OUTPATIENT_CLINIC_OR_DEPARTMENT_OTHER)
Admission: EM | Admit: 2014-12-15 | Discharge: 2014-12-15 | Disposition: A | Discharge status: Home / Self Care | Payer: 59 | Attending: Emergency Medicine | Admitting: Emergency Medicine

## 2014-12-15 ENCOUNTER — Encounter (HOSPITAL_BASED_OUTPATIENT_CLINIC_OR_DEPARTMENT_OTHER): Payer: Self-pay

## 2014-12-15 DIAGNOSIS — N9489 Other specified conditions associated with female genital organs and menstrual cycle: Secondary | ICD-10-CM | POA: Insufficient documentation

## 2014-12-15 DIAGNOSIS — Z3202 Encounter for pregnancy test, result negative: Secondary | ICD-10-CM | POA: Diagnosis not present

## 2014-12-15 DIAGNOSIS — R1030 Lower abdominal pain, unspecified: Secondary | ICD-10-CM | POA: Diagnosis present

## 2014-12-15 LAB — URINALYSIS, ROUTINE W REFLEX MICROSCOPIC
Bilirubin Urine: NEGATIVE
GLUCOSE, UA: NEGATIVE mg/dL
Ketones, ur: NEGATIVE mg/dL
Nitrite: NEGATIVE
PH: 5.5 (ref 5.0–8.0)
PROTEIN: NEGATIVE mg/dL
Specific Gravity, Urine: 1.011 (ref 1.005–1.030)

## 2014-12-15 LAB — URINE MICROSCOPIC-ADD ON

## 2014-12-15 LAB — PREGNANCY, URINE: Preg Test, Ur: NEGATIVE

## 2014-12-15 NOTE — Discharge Instructions (Signed)
You have been seen today for minor abdominal pain. Follow up with PCP or OB/GYN on this matter. Return to ED should symptoms worsen.

## 2014-12-15 NOTE — ED Provider Notes (Signed)
Medical screening examination/treatment/procedure(s) were conducted as a shared visit with non-physician practitioner(s) and myself.  I personally evaluated the patient during the encounter.   EKG Interpretation None      Results for orders placed or performed during the hospital encounter of 12/15/14  Urinalysis, Routine w reflex microscopic (not at [facility])  Result Value Ref Range   Color, Urine YELLOW YELLOW   APPearance CLOUDY (A) CLEAR   Specific Gravity, Urine 1.011 1.005 - 1.030   pH 5.5 5.0 - 8.0   Glucose, UA NEGATIVE NEGATIVE mg/dL   Hgb urine dipstick TRACE (A) NEGATIVE   Bilirubin Urine NEGATIVE NEGATIVE   Ketones, ur NEGATIVE NEGATIVE mg/dL   Protein, ur NEGATIVE NEGATIVE mg/dL   Nitrite NEGATIVE NEGATIVE   Leukocytes, UA SMALL (A) NEGATIVE  Pregnancy, urine  Result Value Ref Range   Preg Test, Ur NEGATIVE NEGATIVE  Urine microscopic-add on  Result Value Ref Range   Squamous Epithelial / LPF 6-30 (A) NONE SEEN   WBC, UA 6-30 0 - 5 WBC/hpf   RBC / HPF 0-5 0 - 5 RBC/hpf   Bacteria, UA RARE (A) NONE SEEN    Patient seen by me. Patient's main concern was whether she was pregnant or not. Now that that has been ruled out she is a feeling much more comfortable about the situation. Patient's urinalysis with questionable urinary contamination will send the culture to rule in or rule out the UTI. Will hold off on antibiotics for now. Patient nontoxic no acute distress. Abdomen without evidence of any acute abdominal process on exam.  Fredia Sorrow, MD 12/15/14 [phone]

## 2014-12-15 NOTE — ED Provider Notes (Signed)
CSN: [id number]     Arrival date & time 12/15/14  1455 History   First MD Initiated Contact with Patient 12/15/14 1553     Chief Complaint  Patient presents with  . Abdominal Pain     (Consider location/radiation/quality/duration/timing/severity/associated sxs/prior Treatment) HPI   Ashley Singleton is a 44 y.o. female, patient with no pertinent past medical history, presenting to the ED with lower abdominal pain. Pain is intermittent, feels like cramping, rates it 1/10, non-radiating. Pt denies fever/chills, N/V/C/D, vaginal discharge or abnormal bleeding, or any other complaints. Pt states the pain comes on twice a week. Pt states, "I just wanted to make sure I am not pregnant." Pt last menstrual period was in October and was normal. Pt does not know if her mother went through early menopause. Pt has not seen her OBGYN since she had her last child 6 years ago. Pt has not taken anything for the pain.     History reviewed. No pertinent past medical history. Past Surgical History  Procedure Laterality Date  . Tonsillectomy     No family history on file. Social History  Substance Use Topics  . Smoking status: Never Smoker   . Smokeless tobacco: None  . Alcohol Use: No   OB History    No data available     Review of Systems  Constitutional: Negative for fever, chills, diaphoresis and unexpected weight change.  Respiratory: Negative for shortness of breath.   Gastrointestinal: Positive for abdominal pain. Negative for nausea, vomiting, diarrhea and constipation.  Genitourinary: Positive for menstrual problem. Negative for dysuria and flank pain.  Musculoskeletal: Negative for back pain.  Skin: Negative for color change and pallor.  Neurological: Negative for dizziness, syncope, weakness and light-headedness.  All other systems reviewed and are negative.     Allergies  Shrimp  Home Medications   Prior to Admission medications   Medication Sig Start Date End Date Taking?  Authorizing Provider  acetaminophen (TYLENOL) 325 MG tablet Take by mouth every 6 (six) hours as needed. Patient used this medication for her headache.    Historical Provider, MD   BP 148/101 mmHg  Pulse 92  Temp(Src) 99.2 F (37.3 C) (Oral)  Resp 16  Ht 5' (1.524 m)  Wt 82.645 kg  BMI 35.58 kg/m2  SpO2 98%  LMP  (LMP Unknown) Physical Exam  Constitutional: She appears well-developed and well-nourished. No distress.  HENT:  Head: Normocephalic and atraumatic.  Eyes: Conjunctivae are normal. Pupils are equal, round, and reactive to light.  Cardiovascular: Normal rate, regular rhythm and normal heart sounds.   Pulmonary/Chest: Effort normal and breath sounds normal. No respiratory distress.  Abdominal: Soft. Normal appearance, normal aorta and bowel sounds are normal. There is no tenderness. There is no CVA tenderness.  Musculoskeletal: She exhibits no edema or tenderness.  Neurological: She is alert.  Skin: Skin is warm and dry. She is not diaphoretic.  Nursing note and vitals reviewed.   ED Course  Procedures (including critical care time) Labs Review Labs Reviewed  URINALYSIS, ROUTINE W REFLEX MICROSCOPIC (NOT AT [facility]) - Abnormal; Notable for the following:    APPearance CLOUDY (*)    Hgb urine dipstick TRACE (*)    Leukocytes, UA SMALL (*)    All other components within normal limits  URINE MICROSCOPIC-ADD ON - Abnormal; Notable for the following:    Squamous Epithelial / LPF 6-30 (*)    Bacteria, UA RARE (*)    All other components within normal limits  URINE  CULTURE  PREGNANCY, URINE    Imaging Review No results found. I have personally reviewed and evaluated these images and lab results as part of my medical decision-making.   EKG Interpretation None      MDM   Final diagnoses:  Lower abdominal pain    Ashley Singleton presents with very mild lower abdominal cramping about once a week that has been going on for months.  Findings and plan of care  discussed with Fredia Sorrow, MD.  Patient made the comment that she really just wanted to make sure she wasn't pregnant. She is in no distress over the abdominal pain complaint and has no accompanying complaints. Patient's UA results likely show contamination, but a urine culture will be obtained. Patient to be discharged with follow-up with her PCP.    Lorayne Bender, PA-C 12/15/14 1641

## 2014-12-15 NOTE — ED Notes (Signed)
Reports abdominal pain and missed period in November.

## 2014-12-16 LAB — URINE CULTURE

## 2017-01-07 ENCOUNTER — Emergency Department (HOSPITAL_COMMUNITY): Payer: Self-pay

## 2017-01-07 ENCOUNTER — Inpatient Hospital Stay (HOSPITAL_COMMUNITY)
Admission: EM | Admit: 2017-01-07 | Discharge: 2017-01-09 | DRG: 812 | Disposition: A | Discharge status: Home / Self Care | Payer: Self-pay | Attending: Internal Medicine | Admitting: Internal Medicine

## 2017-01-07 ENCOUNTER — Other Ambulatory Visit: Payer: Self-pay

## 2017-01-07 ENCOUNTER — Encounter (HOSPITAL_COMMUNITY): Payer: Self-pay | Admitting: Emergency Medicine

## 2017-01-07 DIAGNOSIS — Z124 Encounter for screening for malignant neoplasm of cervix: Secondary | ICD-10-CM

## 2017-01-07 DIAGNOSIS — R195 Other fecal abnormalities: Secondary | ICD-10-CM | POA: Diagnosis present

## 2017-01-07 DIAGNOSIS — D72819 Decreased white blood cell count, unspecified: Secondary | ICD-10-CM | POA: Diagnosis present

## 2017-01-07 DIAGNOSIS — Z791 Long term (current) use of non-steroidal anti-inflammatories (NSAID): Secondary | ICD-10-CM

## 2017-01-07 DIAGNOSIS — Z833 Family history of diabetes mellitus: Secondary | ICD-10-CM

## 2017-01-07 DIAGNOSIS — Z5309 Procedure and treatment not carried out because of other contraindication: Secondary | ICD-10-CM | POA: Diagnosis not present

## 2017-01-07 DIAGNOSIS — Z807 Family history of other malignant neoplasms of lymphoid, hematopoietic and related tissues: Secondary | ICD-10-CM

## 2017-01-07 DIAGNOSIS — R011 Cardiac murmur, unspecified: Secondary | ICD-10-CM

## 2017-01-07 DIAGNOSIS — Z91013 Allergy to seafood: Secondary | ICD-10-CM

## 2017-01-07 DIAGNOSIS — D259 Leiomyoma of uterus, unspecified: Secondary | ICD-10-CM

## 2017-01-07 DIAGNOSIS — N920 Excessive and frequent menstruation with regular cycle: Secondary | ICD-10-CM | POA: Diagnosis present

## 2017-01-07 DIAGNOSIS — D500 Iron deficiency anemia secondary to blood loss (chronic): Principal | ICD-10-CM

## 2017-01-07 DIAGNOSIS — D509 Iron deficiency anemia, unspecified: Secondary | ICD-10-CM

## 2017-01-07 DIAGNOSIS — E876 Hypokalemia: Secondary | ICD-10-CM | POA: Diagnosis present

## 2017-01-07 DIAGNOSIS — D649 Anemia, unspecified: Secondary | ICD-10-CM | POA: Diagnosis present

## 2017-01-07 DIAGNOSIS — R55 Syncope and collapse: Secondary | ICD-10-CM | POA: Diagnosis present

## 2017-01-07 DIAGNOSIS — Z23 Encounter for immunization: Secondary | ICD-10-CM

## 2017-01-07 HISTORY — DX: Benign neoplasm of connective and other soft tissue, unspecified: D21.9

## 2017-01-07 LAB — TYPE AND SCREEN
ABO/RH(D): AB POS
ANTIBODY SCREEN: NEGATIVE

## 2017-01-07 LAB — CBC
HEMATOCRIT: 29.1 % — AB (ref 36.0–46.0)
Hemoglobin: 8.5 g/dL — ABNORMAL LOW (ref 12.0–15.0)
MCH: 19.6 pg — AB (ref 26.0–34.0)
MCHC: 29.2 g/dL — AB (ref 30.0–36.0)
MCV: 67.2 fL — AB (ref 78.0–100.0)
PLATELETS: 322 10*3/uL (ref 150–400)
RBC: 4.33 MIL/uL (ref 3.87–5.11)
RDW: 19.5 % — AB (ref 11.5–15.5)
WBC: 2.5 10*3/uL — ABNORMAL LOW (ref 4.0–10.5)

## 2017-01-07 LAB — TSH: TSH: 2.329 u[IU]/mL (ref 0.350–4.500)

## 2017-01-07 LAB — CBC WITH DIFFERENTIAL/PLATELET
BASOS ABS: 0 10*3/uL (ref 0.0–0.1)
Basophils Relative: 0 %
Eosinophils Absolute: 0.1 10*3/uL (ref 0.0–0.7)
Eosinophils Relative: 1 %
HCT: 26.2 % — ABNORMAL LOW (ref 36.0–46.0)
Hemoglobin: 7.7 g/dL — ABNORMAL LOW (ref 12.0–15.0)
LYMPHS ABS: 0.9 10*3/uL (ref 0.7–4.0)
Lymphocytes Relative: 15 %
MCH: 19.4 pg — ABNORMAL LOW (ref 26.0–34.0)
MCHC: 29.4 g/dL — AB (ref 30.0–36.0)
MCV: 66.2 fL — ABNORMAL LOW (ref 78.0–100.0)
MONO ABS: 0.3 10*3/uL (ref 0.1–1.0)
Monocytes Relative: 5 %
NEUTROS ABS: 4.5 10*3/uL (ref 1.7–7.7)
Neutrophils Relative %: 79 %
PLATELETS: 305 10*3/uL (ref 150–400)
RBC: 3.96 MIL/uL (ref 3.87–5.11)
RDW: 19.1 % — AB (ref 11.5–15.5)
WBC: 5.8 10*3/uL (ref 4.0–10.5)

## 2017-01-07 LAB — BASIC METABOLIC PANEL
Anion gap: 8 (ref 5–15)
BUN: 7 mg/dL (ref 6–20)
CALCIUM: 8.5 mg/dL — AB (ref 8.9–10.3)
CO2: 24 mmol/L (ref 22–32)
CREATININE: 0.77 mg/dL (ref 0.44–1.00)
Chloride: 102 mmol/L (ref 101–111)
GFR calc Af Amer: >60 mL/min (ref >60)
GLUCOSE: 104 mg/dL — AB (ref 65–99)
Potassium: 3.2 mmol/L — ABNORMAL LOW (ref 3.5–5.1)
SODIUM: 134 mmol/L — AB (ref 135–145)

## 2017-01-07 LAB — RETICULOCYTES
RBC.: 4.55 MIL/uL (ref 3.87–5.11)
RETIC CT PCT: 0.4 % (ref 0.4–3.1)
Retic Count, Absolute: 18.2 10*3/uL — ABNORMAL LOW (ref 19.0–186.0)

## 2017-01-07 LAB — IRON AND TIBC
IRON: 12 ug/dL — AB (ref 28–170)
Saturation Ratios: 2 % — ABNORMAL LOW (ref 10.4–31.8)
TIBC: 573 ug/dL — AB (ref 250–450)
UIBC: 561 ug/dL

## 2017-01-07 LAB — POC OCCULT BLOOD, ED: Fecal Occult Bld: POSITIVE — AB

## 2017-01-07 LAB — FOLATE: FOLATE: 26 ng/mL (ref >5.9)

## 2017-01-07 LAB — FERRITIN: Ferritin: 14 ng/mL (ref 11–307)

## 2017-01-07 LAB — ABO/RH: ABO/RH(D): AB POS

## 2017-01-07 LAB — VITAMIN B12: Vitamin B-12: 503 pg/mL (ref 180–914)

## 2017-01-07 LAB — I-STAT BETA HCG BLOOD, ED (MC, WL, AP ONLY): I-stat hCG, quantitative: <5 m[IU]/mL (ref <5)

## 2017-01-07 LAB — CBG MONITORING, ED: Glucose-Capillary: 94 mg/dL (ref 65–99)

## 2017-01-07 MED ORDER — PANTOPRAZOLE SODIUM 40 MG IV SOLR
40.0000 mg | Freq: Two times a day (BID) | INTRAVENOUS | Status: DC
  Administered 2017-01-07 – 2017-01-08 (×4): 40 mg via INTRAVENOUS
  Filled 2017-01-07 (×5): qty 40

## 2017-01-07 MED ORDER — ACETAMINOPHEN 325 MG PO TABS: 650.0000 mg | ORAL_TABLET | Freq: Four times a day (QID) | ORAL | Status: DC | PRN

## 2017-01-07 MED ORDER — FERROUS SULFATE 325 (65 FE) MG PO TABS
325.0000 mg | ORAL_TABLET | Freq: Every day | ORAL | Status: DC
  Administered 2017-01-08 – 2017-01-09 (×2): 325 mg via ORAL
  Filled 2017-01-07 (×2): qty 1

## 2017-01-07 MED ORDER — POTASSIUM CHLORIDE CRYS ER 20 MEQ PO TBCR
40.0000 meq | EXTENDED_RELEASE_TABLET | Freq: Two times a day (BID) | ORAL | Status: DC
  Filled 2017-01-07: qty 2

## 2017-01-07 MED ORDER — SODIUM CHLORIDE 0.9 % IV SOLN
INTRAVENOUS | Status: AC
  Administered 2017-01-07: 11:00:00 via INTRAVENOUS

## 2017-01-07 MED ORDER — ACETAMINOPHEN 650 MG RE SUPP: 650.0000 mg | Freq: Four times a day (QID) | RECTAL | Status: DC | PRN

## 2017-01-07 MED ORDER — POTASSIUM CHLORIDE CRYS ER 20 MEQ PO TBCR
40.0000 meq | EXTENDED_RELEASE_TABLET | Freq: Once | ORAL | Status: AC
  Administered 2017-01-07: 40 meq via ORAL
  Filled 2017-01-07: qty 2

## 2017-01-07 NOTE — Progress Notes (Signed)
New Admission Note:    Arrival Method: Stretcher from ER Mental Orientation:Alert & Oriented x 4 Assessment: Completed Skin: WNL IV: RAC Pain:0/10 Safety Measures:Side rails up and call bell in reach Admission:In progress and will be Completed 5W Orientation:Complete Family: None at bedside   Orders have been reviewed and implemented.  Will continue to monitor the patient.

## 2017-01-07 NOTE — ED Provider Notes (Signed)
[facility] Provider Note   CSN: [id number] Arrival date & time: 01/07/17  7829     History   Chief Complaint Chief Complaint  Patient presents with  . Loss of Consciousness    HPI Ashley Singleton is a 46 y.o. female.  The history is provided by the patient and the EMS personnel. No language interpreter was used.  Loss of Consciousness      Ashley Singleton is a 46 y.o. female who presents to the Emergency Department complaining of syncope.  She presents via EMS following a syncopal event earlier today.  She reports feeling fatigued for several weeks as well as frequent headaches for the last year.  No headaches for the last few days but she has been tired.  She got up this morning and went to work as usual.  She clocked in and gone to a meeting and she was leaning against a wall.  She does not recall what happened.  She does not know if she passed out or fell asleep.  Per EMS there was no fall or injury.  She has no known medical problems.  She does take occasional ibuprofen, no additional medications.  She does not see a doctor and does not receive routine health screenings.  She does report occasional heavy menstrual cycles.  Her last cycle started about 4 days ago and is not particularly heavy.  No black or bloody stools.  No fevers, chest pain, shortness of breath, nausea, vomiting, abdominal pain. History reviewed. No pertinent past medical history.  Patient Active Problem List   Diagnosis Date Noted  . Symptomatic anemia 01/07/2017    Past Surgical History:  Procedure Laterality Date  . TONSILLECTOMY      OB History    No data available       Home Medications    Prior to Admission medications   Medication Sig Start Date End Date Taking? Authorizing Provider  ibuprofen (ADVIL,MOTRIN) 200 MG tablet Take 200-400 mg by mouth every 6 (six) hours as needed (for headaches).   Yes [provider]    Family History Family History  Problem  Relation Age of Onset  . Diabetes Mother   . Multiple myeloma Maternal Grandmother   . Prostate cancer Maternal Grandfather     Social History Social History   Tobacco Use  . Smoking status: Never Smoker  . Smokeless tobacco: Never Used  Substance Use Topics  . Alcohol use: No  . Drug use: No     Allergies   Shrimp [shellfish allergy]   Review of Systems Review of Systems  Cardiovascular: Positive for syncope.  All other systems reviewed and are negative.    Physical Exam Updated Vital Signs BP 132/89 (BP Location: Left Arm)   Pulse 74   Temp 98 F (36.7 C) (Oral)   Resp 18   Ht 5' 3"  (1.6 m)   SpO2 100%   BMI 32.28 kg/m   Physical Exam  Constitutional: She is oriented to person, place, and time. She appears well-developed and well-nourished.  Appears fatigued  HENT:  Head: Normocephalic and atraumatic.  Cardiovascular: Normal rate and regular rhythm.  Murmur heard. Systolic ejection murmur  Pulmonary/Chest: Effort normal and breath sounds normal. No respiratory distress.  Abdominal: Soft. There is no tenderness. There is no rebound and no guarding.  Genitourinary:  Genitourinary Comments: No gross blood in rectal vault.  Nontender nonthrombosed external hemorrhoids.  Musculoskeletal: She exhibits no edema or tenderness.  Neurological: She  is alert and oriented to person, place, and time.  5 out of 5 strength in all 4 extremities with sensation to light touch intact in all 4 extremities  Skin: Skin is warm and dry.  Psychiatric: She has a normal mood and affect. Her behavior is normal.  Nursing note and vitals reviewed.    ED Treatments / Results  Labs (all labs ordered are listed, but only abnormal results are displayed) Labs Reviewed  BASIC METABOLIC PANEL - Abnormal; Notable for the following components:      Result Value   Sodium 134 (*)    Potassium 3.2 (*)    Glucose, Bld 104 (*)    Calcium 8.5 (*)    All other components within normal  limits  CBC WITH DIFFERENTIAL/PLATELET - Abnormal; Notable for the following components:   Hemoglobin 7.7 (*)    HCT 26.2 (*)    MCV 66.2 (*)    MCH 19.4 (*)    MCHC 29.4 (*)    RDW 19.1 (*)    All other components within normal limits  IRON AND TIBC - Abnormal; Notable for the following components:   Iron 12 (*)    TIBC 573 (*)    Saturation Ratios 2 (*)    All other components within normal limits  RETICULOCYTES - Abnormal; Notable for the following components:   Retic Count, Absolute 18.2 (*)    All other components within normal limits  CBC - Abnormal; Notable for the following components:   WBC 2.5 (*)    Hemoglobin 8.5 (*)    HCT 29.1 (*)    MCV 67.2 (*)    MCH 19.6 (*)    MCHC 29.2 (*)    RDW 19.5 (*)    All other components within normal limits  POC OCCULT BLOOD, ED - Abnormal; Notable for the following components:   Fecal Occult Bld POSITIVE (*)    All other components within normal limits  VITAMIN B12  FOLATE  FERRITIN  TSH  HIV ANTIBODY (ROUTINE TESTING)  CBC  BASIC METABOLIC PANEL  I-STAT BETA HCG BLOOD, ED ([facility], [facility], [facility] ONLY)  CBG MONITORING, ED  TYPE AND SCREEN  ABO/RH    EKG  EKG Interpretation  Date/Time:  Saturday January 07 2017 06:57:55 EST Ventricular Rate:  72 PR Interval:    QRS Duration: 76 QT Interval:  397 QTC Calculation: 435 R Axis:   53 Text Interpretation:  Sinus rhythm LAE, consider biatrial enlargement Confirmed by Quintella Reichert [phone]) on 01/07/2017 7:25:32 AM       Radiology Dg Chest 2 View  Result Date: 01/07/2017 CLINICAL DATA:  Syncopal episode EXAM: CHEST  2 VIEW COMPARISON:  07/05/2011 FINDINGS: There is no focal parenchymal opacity. There is no pleural effusion or pneumothorax. The heart and mediastinal contours are unremarkable. The osseous structures are unremarkable. IMPRESSION: No active cardiopulmonary disease. Electronically Signed   By: Kathreen Devoid   On: 01/07/2017 08:11    Procedures Procedures  (including critical care time)  Medications Ordered in ED Medications  0.9 %  sodium chloride infusion ( Intravenous New Bag/Given 01/07/17 1050)  potassium chloride SA (K-DUR,KLOR-CON) CR tablet 40 mEq (not administered)  pantoprazole (PROTONIX) injection 40 mg (not administered)  acetaminophen (TYLENOL) tablet 650 mg (not administered)    Or  acetaminophen (TYLENOL) suppository 650 mg (not administered)  ferrous sulfate tablet 325 mg (not administered)     Initial Impression / Assessment and Plan / ED Course  I have reviewed the triage vital  signs and the nursing notes.  Pertinent labs & imaging results that were available during my care of the patient were reviewed by me and considered in my medical decision making (see chart for details).     Patient here for evaluation following syncopal event today.  She has no known past medical history.  She is noted to be anemic in the emergency department with a hemoglobin of 7.  There is no evidence of active hemorrhage in the department.  Medicine consulted for admission for syncope with significant anemia.  Patient with prefer to defer blood transfusion in the emergency department and only transfuse if symptoms worsen.  Final Clinical Impressions(s) / ED Diagnoses   Final diagnoses:  None    ED Discharge Orders    None       Quintella Reichert, MD 01/07/17 [phone]

## 2017-01-07 NOTE — ED Triage Notes (Signed)
Pt transported via [facility] from work, pt reports possible syncopal episode, pt denies injury, denies fall.  Denies pain, denies shob, BS 112, 16, 80, 113/73. #20 R AC est by EMS

## 2017-01-07 NOTE — H&P (Signed)
   Date: 01/07/2017               Patient Name:  Ashley Singleton MRN: [id number]  DOB: 11/13/1970 Age / Sex: 46 y.o., female   PCP: Dillard, Naima, MD         Medical Service: Internal Medicine Teaching Service         Attending Physician: Dr. Mullen, Emily B, MD    First Contact: Dr. Amry Cathy Pager: [phone]  Second Contact: Dr. Svalina Pager: [phone]       After Hours (After 5p/  First Contact Pager: [phone]  weekends / holidays): Second Contact Pager: [phone]   Chief Complaint: syncopal episode  History of Present Illness:  46 yo female with no PMHx presenting after a fall at work. She states she was standing near coworkers at a meeting and kept having to reposition herself, as well as support her self with shelves, next thing she knew she fell to the ground. Denies loss of consciousness and recalls the event. She had no confusion after the episode, was awake and alert, and rested in a chair for 4-5 minutes afterward. No bowel/bladder incontinence, no body shakes or tongue biting. Prior to the event she states she felt tired, worried, and warm, but denies other prodromal syndromes such as lightheadedness, dizziness, or nausea. This has never happened to the patient before.   The patient states she has been feeling tired, but could not provide details to how long this has been going on for. She attributes her fatigue to long work hours and life stressors. She has not been seen by a doctor since the birth of her daughter 8 years ago. Has a regular menstrual cycle lasting 4-5 days. Menstrual flow is typically heavy without significant pain, and patient states she uses 4-5 pads per day. Has a history of fibroids, pelvic ultrasound in 10/2007 documents multiple fibroids. Patient also denies melena, hematuria, or BRBPR. She does use Advil several times per week for headaches (atleast 2x per week), but denies abdominal pain. No history of reflux and denies symptoms of GERD. Patient states she  may have been on iron over twenty years ago, but otherwise was never told she was anemic in the past. No complications during pregnancy or child birth. Denies shortness of breath on exertion, palpitations, chest pain, or recent illness or infections.   ED course: Vials: BP 123/78   Pulse 74   Temp 100 F (37.8 C) (Oral)   Resp (!) 21   SpO2 100%  Labs: Hgb 7.7, MCV 66.2; hypokalemia 3.2, FOBT +, Urine pregnancy test negative Meds: non given  Meds:  Takes no prescription medications.  Advil PRN for headaches   Allergies: Allergies as of 01/07/2017 - Review Complete 01/07/2017  Allergen Reaction Noted  . Shrimp [shellfish allergy] Itching 07/05/2011   History reviewed. No pertinent past medical history.  Family History:  Family History  Problem Relation Age of Onset  . Diabetes Mother   . Multiple myeloma Maternal Grandmother   . Prostate cancer Maternal Grandfather     Social History:  Social History   Tobacco Use  . Smoking status: Never Smoker  . Smokeless tobacco: Never Used  Substance Use Topics  . Alcohol use: No  . Drug use: No    Review of Systems: A complete ROS was negative except as per HPI.   Physical Exam: Blood pressure 117/76, pulse 69, temperature 100 F (37.8 C), temperature source Oral, resp. rate 17, SpO2 100 %. Physical Exam    Constitutional: She is oriented to person, place, and time. She appears well-developed and well-nourished. No distress.  HENT:  Head: Normocephalic and atraumatic.  Eyes: EOM are normal. No scleral icterus.  +conjuctival pallor  Neck: Normal range of motion. Neck supple. No thyromegaly present.  Cardiovascular: Normal rate, regular rhythm and normal heart sounds. Exam reveals no gallop and no friction rub.  No murmur heard. Pulmonary/Chest: Effort normal and breath sounds normal. No respiratory distress. She has no wheezes.  Abdominal: Soft. Bowel sounds are normal. There is no tenderness.  Musculoskeletal: Normal  range of motion. She exhibits no edema.  Lymphadenopathy:    She has no cervical adenopathy.  Neurological: She is alert and oriented to person, place, and time. No cranial nerve deficit.  No focal neurological deficits.  Skin: Skin is warm and dry.  Psychiatric: She exhibits a depressed mood.    EKG: personally reviewed my interpretation is sinus rhythm, no evidence of acute ST changes  CXR: personally reviewed my interpretation is negative for opacity or pleural effusion, unremarkable  Assessment & Plan by Problem: Active Problems:   Symptomatic anemia  Symptomatic Anemia Microcytic anemia Hgb 7.7, MCV 66.2. Last CBC in 2013, Hgb 11.5. FOBT positive. Patient's anemia may be multifactorial: Frequent NSAID use increases her risk of an upper GI bleed, BUN WNL. History of fibroids and heavy menstrual cycles could also be contributing, as well as iron deficiency anemia. As of now patient declined transfusion, as she feels in her normal state of health. C Discussed with patient that if her Hgb continues to decrease will most likely need transfusion.  -Repeat CBC at 5 PM -IV PPI BID -Iron studies, ferritin, retic count -B12 and Folate level -GI consult  -IVF: 125 cc/hr  Hypokalemia  Potassium level 3.2.  -Kdur 40 meq x 1  -Repeat BMET in AM   Code Status: FULL Code, confirmed on admission DVT ppx: SCDs  Dispo: Admit patient to Observation with expected length of stay less than 2 midnights.  Signed: Sreshta Cressler J, MD 01/07/2017, 10:44 AM  Pager: [phone] 

## 2017-01-07 NOTE — H&P (Signed)
Date: 01/07/2017               Patient Name:  Ashley Singleton MRN: [id number]  DOB: 19-Nov-1970 Age / Sex: 46 y.o., female   PCP: Crawford Givens, MD         Medical Service: Internal Medicine Teaching Service         Attending Physician: Dr. Sid Falcon, MD    First Contact: Dr. Aggie Hacker Pager: [phone]  Second Contact: Dr. Jari Favre Pager: [phone]       After Hours (After 5p/  First Contact Pager: [phone]  weekends / holidays): Second Contact Pager: [phone]   Chief Complaint: syncopal episode  History of Present Illness:  46 yo female with no PMHx presenting after a fall at work. She states she was standing near coworkers at a meeting and kept having to reposition herself, as well as support her self with shelves, next thing she knew she fell to the ground. Denies loss of consciousness and recalls the event. She had no confusion after the episode, was awake and alert, and rested in a chair for 4-5 minutes afterward. No bowel/bladder incontinence, no body shakes or tongue biting. Prior to the event she states she felt tired, worried, and warm, but denies other prodromal syndromes such as lightheadedness, dizziness, or nausea. This has never happened to the patient before.   The patient states she has been feeling tired, but could not provide details to how long this has been going on for. She attributes her fatigue to long work hours and life stressors. She has not been seen by a doctor since the birth of her daughter 8 years ago. Has a regular menstrual cycle lasting 4-5 days. Menstrual flow is typically heavy without significant pain, and patient states she uses 4-5 pads per day. Has a history of fibroids, pelvic ultrasound in 10/2007 documents multiple fibroids. Patient also denies melena, hematuria, or BRBPR. She does use Advil several times per week for headaches (atleast 2x per week), but denies abdominal pain. No history of reflux and denies symptoms of GERD. Patient states she  may have been on iron over twenty years ago, but otherwise was never told she was anemic in the past. No complications during pregnancy or child birth. Denies shortness of breath on exertion, palpitations, chest pain, or recent illness or infections.   ED course: Vials: BP 123/78   Pulse 74   Temp 100 F (37.8 C) (Oral)   Resp (!) 21   SpO2 100%  Labs: Hgb 7.7, MCV 66.2; hypokalemia 3.2, FOBT +, Urine pregnancy test negative Meds: non given  Meds:  Takes no prescription medications.  Advil PRN for headaches   Allergies: Allergies as of 01/07/2017 - Review Complete 01/07/2017  Allergen Reaction Noted  . Shrimp [shellfish allergy] Itching 07/05/2011   History reviewed. No pertinent past medical history.  Family History:  Family History  Problem Relation Age of Onset  . Diabetes Mother   . Multiple myeloma Maternal Grandmother   . Prostate cancer Maternal Grandfather     Social History:  Social History   Tobacco Use  . Smoking status: Never Smoker  . Smokeless tobacco: Never Used  Substance Use Topics  . Alcohol use: No  . Drug use: No    Review of Systems: A complete ROS was negative except as per HPI.   Physical Exam: Blood pressure 117/76, pulse 69, temperature 100 F (37.8 C), temperature source Oral, resp. rate 17, SpO2 100 %. Physical Exam  Constitutional: She is oriented to person, place, and time. She appears well-developed and well-nourished. No distress.  HENT:  Head: Normocephalic and atraumatic.  Eyes: EOM are normal. No scleral icterus.  +conjuctival pallor  Neck: Normal range of motion. Neck supple. No thyromegaly present.  Cardiovascular: Normal rate, regular rhythm and normal heart sounds. Exam reveals no gallop and no friction rub.  No murmur heard. Pulmonary/Chest: Effort normal and breath sounds normal. No respiratory distress. She has no wheezes.  Abdominal: Soft. Bowel sounds are normal. There is no tenderness.  Musculoskeletal: Normal  range of motion. She exhibits no edema.  Lymphadenopathy:    She has no cervical adenopathy.  Neurological: She is alert and oriented to person, place, and time. No cranial nerve deficit.  No focal neurological deficits.  Skin: Skin is warm and dry.  Psychiatric: She exhibits a depressed mood.    EKG: personally reviewed my interpretation is sinus rhythm, no evidence of acute ST changes  CXR: personally reviewed my interpretation is negative for opacity or pleural effusion, unremarkable  Assessment & Plan by Problem: Active Problems:   Symptomatic anemia  Symptomatic Anemia Microcytic anemia Hgb 7.7, MCV 66.2. Last CBC in 2013, Hgb 11.5. FOBT positive. Patient's anemia may be multifactorial: Frequent NSAID use increases her risk of an upper GI bleed, BUN WNL. History of fibroids and heavy menstrual cycles could also be contributing, as well as iron deficiency anemia. As of now patient declined transfusion, as she feels in her normal state of health. C Discussed with patient that if her Hgb continues to decrease will most likely need transfusion.  -Repeat CBC at 5 PM -IV PPI BID -Iron studies, ferritin, retic count -B12 and Folate level -GI consult  -IVF: 125 cc/hr  Hypokalemia  Potassium level 3.2.  -Kdur 40 meq x 1  -Repeat BMET in AM   Code Status: FULL Code, confirmed on admission DVT ppx: SCDs  Dispo: Admit patient to Observation with expected length of stay less than 2 midnights.  Signed: Melanee Spry, MD 01/07/2017, 10:44 AM  Pager: [phone]

## 2017-01-08 ENCOUNTER — Encounter (HOSPITAL_COMMUNITY): Payer: Self-pay | Admitting: Internal Medicine

## 2017-01-08 DIAGNOSIS — D72819 Decreased white blood cell count, unspecified: Secondary | ICD-10-CM

## 2017-01-08 DIAGNOSIS — D500 Iron deficiency anemia secondary to blood loss (chronic): Secondary | ICD-10-CM

## 2017-01-08 LAB — CBC
HCT: 27.1 % — ABNORMAL LOW (ref 36.0–46.0)
Hemoglobin: 7.9 g/dL — ABNORMAL LOW (ref 12.0–15.0)
MCH: 19.4 pg — ABNORMAL LOW (ref 26.0–34.0)
MCHC: 29.2 g/dL — ABNORMAL LOW (ref 30.0–36.0)
MCV: 66.4 fL — ABNORMAL LOW (ref 78.0–100.0)
PLATELETS: 320 10*3/uL (ref 150–400)
RBC: 4.08 MIL/uL (ref 3.87–5.11)
RDW: 19.1 % — AB (ref 11.5–15.5)
WBC: 2.1 10*3/uL — AB (ref 4.0–10.5)

## 2017-01-08 LAB — BASIC METABOLIC PANEL
ANION GAP: 4 — AB (ref 5–15)
BUN: 5 mg/dL — ABNORMAL LOW (ref 6–20)
CALCIUM: 8.5 mg/dL — AB (ref 8.9–10.3)
CO2: 24 mmol/L (ref 22–32)
Chloride: 107 mmol/L (ref 101–111)
Creatinine, Ser: 0.69 mg/dL (ref 0.44–1.00)
Glucose, Bld: 98 mg/dL (ref 65–99)
Potassium: 3.7 mmol/L (ref 3.5–5.1)
Sodium: 135 mmol/L (ref 135–145)

## 2017-01-08 LAB — HIV ANTIBODY (ROUTINE TESTING W REFLEX): HIV SCREEN 4TH GENERATION: NONREACTIVE

## 2017-01-08 MED ORDER — PEG-KCL-NACL-NASULF-NA ASC-C 100 G PO SOLR
0.5000 | Freq: Once | ORAL | Status: AC
  Administered 2017-01-08: 100 g via ORAL
  Filled 2017-01-08: qty 1

## 2017-01-08 MED ORDER — PEG-KCL-NACL-NASULF-NA ASC-C 100 G PO SOLR
0.5000 | Freq: Once | ORAL | Status: AC
  Administered 2017-01-09: 100 g via ORAL

## 2017-01-08 MED ORDER — SODIUM CHLORIDE 0.9 % IV SOLN
510.0000 mg | Freq: Once | INTRAVENOUS | Status: AC
  Administered 2017-01-08: 510 mg via INTRAVENOUS
  Filled 2017-01-08: qty 17

## 2017-01-08 MED ORDER — PEG-KCL-NACL-NASULF-NA ASC-C 100 G PO SOLR: 1.0000 | Freq: Once | ORAL | Status: DC

## 2017-01-08 NOTE — Consult Note (Addendum)
Referring Provider: Internal medicine teaching service   Primary Care Physician: None per patient Primary Gastroenterologist:   Althia Forts  Reason for Consultation: Anemia  ASSESSMENT AND PLAN:    45.  46 year old black female admitted with microcytic anemia and syncopal episode at work .  MCV is low, ferritin is borderline abnormally low and iron binding capacity elevated at 573 suggesting iron deficiency. Heme positive stool. Hemoglobin 7.9, the only comparison lab dates back to 2013 when hgb it was~ 11.  No overt bleeding, she has had no shortness of breath, no preceding weakness or dizziness.  Her MCV is in the 60s.  Suspect chronic GI blood loss from menorrhagia but reasonable to exclude a GI source.  -Discussed EGD and colonoscopy with patient.  She is agreeable to proceed with workup. The risks and benefits of EGD / colonoscopy with possible biopsies / polypectomies were discussed and the patient agrees to proceed.    2. Leukopenia. WBC 2.1.  May need hematology consult, especially if endoscopic workup is negative    [facility] Attending   I have taken an interval history, reviewed the chart and saw the patient. I agree with the Advanced Practitioner's note, impression and recommendations.   She is heme + so could have GI neoplasia or other bleeding so colonoscopy/EGD indicated. Undoubtedly some contribution from menstrual blood loss.  I reviewed risks benefits and indications of procedure. She agrees to proceed. I explained that Dr. Loletha Carrow would be performing the procedures.  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  Gatha Mayer, MD, [facility] [phone] (pager) 01/08/2017 12:07 PM    HPI: Ashley Singleton is a 46 y.o. female who had a syncopal episode at work yesterday.  She remembers trying to get comfortable, closing her eyes then apparently hit the floor.   Prior to this episode patient has felt fine.  ED course: WBC 5.8 , hemoglobin 7.7 with MCV 66.  Stool Hemoccult positive.  BUN 5.  This a.m. WBC 2.1, hemoglobin stable at 7.9.  Of note, patient has not seen a primary care provider in many years.  In June 2013 her hemoglobin was 11.5. Chest x-ray normal EKG shows sinus rhythm, LAE, consider biatrial enlargement  Ashley Singleton has not been having any weakness, dizziness, shortness of breath nor chest pain.  She denies overt GI bleeding.  She is still menstruating, sometimes cycles last a day or 2 longer than normal.  She denies heavy bleeding.  Told years ago she had fibroids.  Patient has no nausea, vomiting, abdominal pain, or bowel changes.  Her weight is stable.  She takes ibuprofen sometimes for headache but usually less than 1 dose a week.  No other NSAIDs.  No GI diseases/illnesses in her family.   Past Medical History:  Diagnosis Date  . Fibroids     Past Surgical History:  Procedure Laterality Date  . TONSILLECTOMY      Prior to Admission medications   Medication Sig Start Date End Date Taking? Authorizing Provider  ibuprofen (ADVIL,MOTRIN) 200 MG tablet Take 200-400 mg by mouth every 6 (six) hours as needed (for headaches).   Yes [provider]    Current Facility-Administered Medications  Medication Dose Route Frequency Provider Last Rate Last Dose  . acetaminophen (TYLENOL) tablet 650 mg  650 mg Oral Q6H PRN Alphonzo Grieve, MD       Or  . acetaminophen (TYLENOL) suppository  650 mg  650 mg Rectal Q6H PRN Alphonzo Grieve, MD      . ferrous sulfate tablet 325 mg  325 mg Oral Q breakfast Alphonzo Grieve, MD      . ferumoxytol (FERAHEME) 510 mg in sodium chloride 0.9 % 100 mL IVPB  510 mg Intravenous Once Melanee Spry, MD      . pantoprazole (PROTONIX) injection 40 mg  40 mg Intravenous Q12H Alphonzo Grieve, MD   40 mg at 01/07/17 2308  . peg 3350 powder (MOVIPREP) kit 100 g  0.5 kit Oral Once Sid Falcon, MD         And  . Derrill Memo ON 01/09/2017] peg 3350 powder (MOVIPREP) kit 100 g  0.5 kit Oral Once Sid Falcon, MD        Allergies as of 01/07/2017 - Review Complete 01/07/2017  Allergen Reaction Noted  . Shrimp [shellfish allergy] Itching 07/05/2011    Family History  Problem Relation Age of Onset  . Diabetes Mother   . Multiple myeloma Maternal Grandmother   . Prostate cancer Maternal Grandfather     Social History   Socioeconomic History  . Marital status: Single       . Number of children: 1                                Occupational History    Employer: [employer]  Tobacco Use  . Smoking status: Never Smoker  . Smokeless tobacco: Never Used  Substance and Sexual Activity  . Alcohol use: No  . Drug use: No  . Sexual activity: No    Birth control/protection: None    Review of Systems: Fall as in HPI Some fatige  Physical Exam: Vital signs in last 24 hours: Temp:  [98.3 F (36.8 C)-99.1 F (37.3 C)] 98.3 F (36.8 C) (12/23 1113) Pulse Rate:  [72-91] 77 (12/23 1113) Resp:  [18-20] 18 (12/23 1113) BP: (99-126)/(62-85) 110/74 (12/23 1113) SpO2:  [100 %] 100 % (12/23 1113) Weight:  [181 lb 10.5 oz (82.4 kg)] 181 lb 10.5 oz (82.4 kg) (12/22 2033) Last BM Date: 01/06/17 General:   Alert, well-developed, black female in NAD Eyes:  Pupils equal, sclera clear, no icterus.   Conjunctiva pale Lungs:  Clear throughout to auscultation.   No wheezes, crackles, or rhonchi.  Heart:  Regular rate and rhythm; no murmurs, no edema Abdomen:  Soft, non-distended, nontender, BS active, no palp mass    Rectal:  Deferred . Marland Kitchen Neurologic:  Alert and  oriented x4;  grossly normal neurologically.    Lab Results: Recent Labs    01/07/17 0700 01/07/17 1646 01/08/17 0658  WBC 5.8 2.5* 2.1*  HGB 7.7* 8.5* 7.9*  HCT 26.2* 29.1* 27.1*  PLT 305 322 320   BMET Recent Labs    01/07/17 0700 01/08/17 0658  NA 134* 135  K 3.2* 3.7  CL 102 107  CO2 24 24  GLUCOSE  104* 98  BUN 7 5*  CREATININE 0.77 0.69  CALCIUM 8.5* 8.5*    Studies/Results: Dg Chest 2 View  Result Date: 01/07/2017 CLINICAL DATA:  Syncopal episode EXAM: CHEST  2 VIEW COMPARISON:  07/05/2011 FINDINGS: There is no focal parenchymal opacity. There is no pleural effusion or pneumothorax. The heart and mediastinal contours are unremarkable. The osseous structures are unremarkable. IMPRESSION: No active cardiopulmonary disease. Electronically Signed   By: Kathreen Devoid   On: 01/07/2017 08:11  Tye Savoy, NP-C @  01/08/2017, 12:07 PM  Pager number [phone]

## 2017-01-08 NOTE — Progress Notes (Signed)
   Subjective:  Patient seen and examined. Doing well this morning, no acute complaints. She has agreed to colonoscopy and EGD while hospitalized. Denies CP, SOB, or lightheaded/dizziness upon standing. Discussed with patient that she would benefit from IV iron, and she agreed.   Objective:  Vital signs in last 24 hours: Vitals:   01/07/17 1607 01/07/17 1724 01/07/17 2033 01/08/17 0402  BP:  126/85 106/62 99/67  Pulse:  76 91 72  Resp:  18 19 20   Temp:  99.1 F (37.3 C) 98.7 F (37.1 C) 98.5 F (36.9 C)  TempSrc:  Oral Oral Oral  SpO2:  100% 100% 100%  Weight:   181 lb 10.5 oz (82.4 kg)   Height: 5\' 3"  (1.6 m)      General: Laying in bed comfortably, NAD HEENT: [place]/AT, EOMI, no scleral icterus, +conjuctival pallor Cardiac: RRR, 2/6 systolic murmur Pulm: normal effort, CTAB in anterior lung fields Abd: soft, non tender, non distended, BS normal Ext: extremities well perfused, no peripheral edema Neuro: alert and oriented X3, cranial nerves II-XII grossly intact   Assessment/Plan:  Active Problems:   Symptomatic anemia  Symptomatic Anemia Microcytic anemia, Iron deficiency anemia Hgb 7.9, MCV 66.4 this AM. Patient's anemia may be multifactorial: Frequent NSAID use increases her risk of an upper GI bleed, BUN WNL. History of fibroids and heavy menstrual cycles could also be contributing. Patient was found to have iron deficiency anemia with Iron level of 12, Ferritin low level normal of 14, and TIBC of 573.  -GI on board and plans to do EGD and colonoscopy 12/24, appreciate recs -Will give Feraheme today -CBC in AM -If EGD/colo negative will need outpatient referral to OB/gyn for work up or menorrhagia as cause of anemia   -tTg, IgA pending   Leukopenia WBC of < 3. B12 and folate within normal limits. Unclear etiology.  -Will continue to monitor with CBCs  Hypokalemia  Resolved. Potassium after replacement 3.7.    Dispo: Anticipated discharge in approximately 1 day.     Melanee Spry, MD 01/08/2017, 11:21 AM Pager: [phone]

## 2017-01-08 NOTE — H&P (View-Only) (Signed)
Referring Provider: Internal medicine teaching service   Primary Care Physician: None per patient Primary Gastroenterologist:   Althia Forts  Reason for Consultation: Anemia  ASSESSMENT AND PLAN:    13.  46 year old black female admitted with microcytic anemia and syncopal episode at work .  MCV is low, ferritin is borderline abnormally low and iron binding capacity elevated at 573 suggesting iron deficiency. Heme positive stool. Hemoglobin 7.9, the only comparison lab dates back to 2013 when hgb it was~ 11.  No overt bleeding, she has had no shortness of breath, no preceding weakness or dizziness.  Her MCV is in the 60s.  Suspect chronic GI blood loss from menorrhagia but reasonable to exclude a GI source.  -Discussed EGD and colonoscopy with patient.  She is agreeable to proceed with workup. The risks and benefits of EGD / colonoscopy with possible biopsies / polypectomies were discussed and the patient agrees to proceed.    2. Leukopenia. WBC 2.1.  May need hematology consult, especially if endoscopic workup is negative    [facility] Attending   I have taken an interval history, reviewed the chart and saw the patient. I agree with the Advanced Practitioner's note, impression and recommendations.   She is heme + so could have GI neoplasia or other bleeding so colonoscopy/EGD indicated. Undoubtedly some contribution from menstrual blood loss.  I reviewed risks benefits and indications of procedure. She agrees to proceed. I explained that Dr. Loletha Carrow would be performing the procedures.  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  Gatha Mayer, MD, [facility] [phone] (pager) 01/08/2017 12:07 PM    HPI: Ashley Singleton is a 46 y.o. female who had a syncopal episode at work yesterday.  She remembers trying to get comfortable, closing her eyes then apparently hit the floor.   Prior to this episode patient has felt fine.  ED course: WBC 5.8 , hemoglobin 7.7 with MCV 66.  Stool Hemoccult positive.  BUN 5.  This a.m. WBC 2.1, hemoglobin stable at 7.9.  Of note, patient has not seen a primary care provider in many years.  In June 2013 her hemoglobin was 11.5. Chest x-ray normal EKG shows sinus rhythm, LAE, consider biatrial enlargement  Leasa has not been having any weakness, dizziness, shortness of breath nor chest pain.  She denies overt GI bleeding.  She is still menstruating, sometimes cycles last a day or 2 longer than normal.  She denies heavy bleeding.  Told years ago she had fibroids.  Patient has no nausea, vomiting, abdominal pain, or bowel changes.  Her weight is stable.  She takes ibuprofen sometimes for headache but usually less than 1 dose a week.  No other NSAIDs.  No GI diseases/illnesses in her family.   Past Medical History:  Diagnosis Date  . Fibroids     Past Surgical History:  Procedure Laterality Date  . TONSILLECTOMY      Prior to Admission medications   Medication Sig Start Date End Date Taking? Authorizing Provider  ibuprofen (ADVIL,MOTRIN) 200 MG tablet Take 200-400 mg by mouth every 6 (six) hours as needed (for headaches).   Yes [provider]    Current Facility-Administered Medications  Medication Dose Route Frequency Provider Last Rate Last Dose  . acetaminophen (TYLENOL) tablet 650 mg  650 mg Oral Q6H PRN Alphonzo Grieve, MD       Or  . acetaminophen (TYLENOL) suppository  650 mg  650 mg Rectal Q6H PRN Alphonzo Grieve, MD      . ferrous sulfate tablet 325 mg  325 mg Oral Q breakfast Alphonzo Grieve, MD      . ferumoxytol (FERAHEME) 510 mg in sodium chloride 0.9 % 100 mL IVPB  510 mg Intravenous Once Melanee Spry, MD      . pantoprazole (PROTONIX) injection 40 mg  40 mg Intravenous Q12H Alphonzo Grieve, MD   40 mg at 01/07/17 2308  . peg 3350 powder (MOVIPREP) kit 100 g  0.5 kit Oral Once Sid Falcon, MD         And  . Derrill Memo ON 01/09/2017] peg 3350 powder (MOVIPREP) kit 100 g  0.5 kit Oral Once Sid Falcon, MD        Allergies as of 01/07/2017 - Review Complete 01/07/2017  Allergen Reaction Noted  . Shrimp [shellfish allergy] Itching 07/05/2011    Family History  Problem Relation Age of Onset  . Diabetes Mother   . Multiple myeloma Maternal Grandmother   . Prostate cancer Maternal Grandfather     Social History   Socioeconomic History  . Marital status: Single       . Number of children: 1                                Occupational History    Employer: [employer]  Tobacco Use  . Smoking status: Never Smoker  . Smokeless tobacco: Never Used  Substance and Sexual Activity  . Alcohol use: No  . Drug use: No  . Sexual activity: No    Birth control/protection: None    Review of Systems: Fall as in HPI Some fatige  Physical Exam: Vital signs in last 24 hours: Temp:  [98.3 F (36.8 C)-99.1 F (37.3 C)] 98.3 F (36.8 C) (12/23 1113) Pulse Rate:  [72-91] 77 (12/23 1113) Resp:  [18-20] 18 (12/23 1113) BP: (99-126)/(62-85) 110/74 (12/23 1113) SpO2:  [100 %] 100 % (12/23 1113) Weight:  [181 lb 10.5 oz (82.4 kg)] 181 lb 10.5 oz (82.4 kg) (12/22 2033) Last BM Date: 01/06/17 General:   Alert, well-developed, black female in NAD Eyes:  Pupils equal, sclera clear, no icterus.   Conjunctiva pale Lungs:  Clear throughout to auscultation.   No wheezes, crackles, or rhonchi.  Heart:  Regular rate and rhythm; no murmurs, no edema Abdomen:  Soft, non-distended, nontender, BS active, no palp mass    Rectal:  Deferred . Marland Kitchen Neurologic:  Alert and  oriented x4;  grossly normal neurologically.    Lab Results: Recent Labs    01/07/17 0700 01/07/17 1646 01/08/17 0658  WBC 5.8 2.5* 2.1*  HGB 7.7* 8.5* 7.9*  HCT 26.2* 29.1* 27.1*  PLT 305 322 320   BMET Recent Labs    01/07/17 0700 01/08/17 0658  NA 134* 135  K 3.2* 3.7  CL 102 107  CO2 24 24  GLUCOSE  104* 98  BUN 7 5*  CREATININE 0.77 0.69  CALCIUM 8.5* 8.5*    Studies/Results: Dg Chest 2 View  Result Date: 01/07/2017 CLINICAL DATA:  Syncopal episode EXAM: CHEST  2 VIEW COMPARISON:  07/05/2011 FINDINGS: There is no focal parenchymal opacity. There is no pleural effusion or pneumothorax. The heart and mediastinal contours are unremarkable. The osseous structures are unremarkable. IMPRESSION: No active cardiopulmonary disease. Electronically Signed   By: Kathreen Devoid   On: 01/07/2017 08:11  Tye Savoy, NP-C @  01/08/2017, 12:07 PM  Pager number [phone]

## 2017-01-09 ENCOUNTER — Inpatient Hospital Stay (HOSPITAL_COMMUNITY): Payer: Self-pay | Admitting: Anesthesiology

## 2017-01-09 ENCOUNTER — Encounter (HOSPITAL_COMMUNITY): Payer: Self-pay | Admitting: *Deleted

## 2017-01-09 ENCOUNTER — Encounter (HOSPITAL_COMMUNITY)
Admission: EM | Disposition: A | Discharge status: Home / Self Care | Payer: Self-pay | Source: Home / Self Care | Attending: Internal Medicine

## 2017-01-09 DIAGNOSIS — R195 Other fecal abnormalities: Secondary | ICD-10-CM

## 2017-01-09 DIAGNOSIS — D500 Iron deficiency anemia secondary to blood loss (chronic): Principal | ICD-10-CM

## 2017-01-09 DIAGNOSIS — Z124 Encounter for screening for malignant neoplasm of cervix: Secondary | ICD-10-CM | POA: Insufficient documentation

## 2017-01-09 HISTORY — PX: ESOPHAGOGASTRODUODENOSCOPY: SHX5428

## 2017-01-09 LAB — BASIC METABOLIC PANEL
ANION GAP: 8 (ref 5–15)
CHLORIDE: 108 mmol/L (ref 101–111)
CO2: 22 mmol/L (ref 22–32)
Calcium: 8.8 mg/dL — ABNORMAL LOW (ref 8.9–10.3)
Creatinine, Ser: 0.7 mg/dL (ref 0.44–1.00)
GFR calc Af Amer: >60 mL/min (ref >60)
Glucose, Bld: 108 mg/dL — ABNORMAL HIGH (ref 65–99)
POTASSIUM: 3.7 mmol/L (ref 3.5–5.1)
Sodium: 138 mmol/L (ref 135–145)

## 2017-01-09 LAB — CBC
HCT: 29.4 % — ABNORMAL LOW (ref 36.0–46.0)
HEMOGLOBIN: 8.6 g/dL — AB (ref 12.0–15.0)
MCH: 19.6 pg — AB (ref 26.0–34.0)
MCHC: 29.3 g/dL — ABNORMAL LOW (ref 30.0–36.0)
MCV: 67 fL — AB (ref 78.0–100.0)
Platelets: 313 10*3/uL (ref 150–400)
RBC: 4.39 MIL/uL (ref 3.87–5.11)
RDW: 19.4 % — ABNORMAL HIGH (ref 11.5–15.5)
WBC: 2.7 10*3/uL — AB (ref 4.0–10.5)

## 2017-01-09 SURGERY — EGD (ESOPHAGOGASTRODUODENOSCOPY)
Anesthesia: Monitor Anesthesia Care

## 2017-01-09 MED ORDER — INFLUENZA VAC SPLIT QUAD 0.5 ML IM SUSY
0.5000 mL | PREFILLED_SYRINGE | INTRAMUSCULAR | Status: AC | PRN
  Administered 2017-01-09: 0.5 mL via INTRAMUSCULAR
  Filled 2017-01-09: qty 0.5

## 2017-01-09 MED ORDER — LACTATED RINGERS IV SOLN
INTRAVENOUS | Status: DC | PRN
  Administered 2017-01-09: 10:00:00 via INTRAVENOUS

## 2017-01-09 MED ORDER — FERROUS SULFATE 325 (65 FE) MG PO TABS: 325.0000 mg | ORAL_TABLET | Freq: Every day | ORAL | 0 refills | Status: DC

## 2017-01-09 MED ORDER — PROPOFOL 500 MG/50ML IV EMUL
INTRAVENOUS | Status: DC | PRN
  Administered 2017-01-09: 125 ug/kg/min via INTRAVENOUS

## 2017-01-09 MED ORDER — BUTAMBEN-TETRACAINE-BENZOCAINE 2-2-14 % EX AERO
INHALATION_SPRAY | CUTANEOUS | Status: DC | PRN
  Administered 2017-01-09: 1 via TOPICAL

## 2017-01-09 MED ORDER — PROPOFOL 10 MG/ML IV BOLUS
INTRAVENOUS | Status: DC | PRN
  Administered 2017-01-09: 20 mg via INTRAVENOUS
  Administered 2017-01-09: 50 mg via INTRAVENOUS
  Administered 2017-01-09: 30 mg via INTRAVENOUS

## 2017-01-09 NOTE — Op Note (Signed)
[facility] Patient Name: Ashley Singleton Procedure Date : 01/09/2017 MRN: [id number] Attending MD: Estill Cotta. Loletha Carrow , MD Date of Birth: 05/29/1970 CSN: [id number] Age: 46 Admit Type: Inpatient Procedure:                Colonoscopy Indications:              Heme positive stool, Iron deficiency anemia Providers:                Mallie Mussel L. Loletha Carrow, MD, Kingsley Plan, RN, Cletis Athens, Technician Referring MD:              Medicines:                Monitored Anesthesia Care Complications:            No immediate complications. Estimated Blood Loss:     Estimated blood loss: none. Procedure:                Pre-Anesthesia Assessment:                           - Prior to the procedure, a History and Physical                            was performed, and patient medications and                            allergies were reviewed. The patient's tolerance of                            previous anesthesia was also reviewed. The risks                            and benefits of the procedure and the sedation                            options and risks were discussed with the patient.                            All questions were answered, and informed consent                            was obtained. Prior Anticoagulants: The patient has                            taken no previous anticoagulant or antiplatelet                            agents. ASA Grade Assessment: II - A patient with                            mild systemic disease. After reviewing the risks  and benefits, the patient was deemed in                            satisfactory condition to undergo the procedure.                           - Prior to the procedure, a History and Physical                            was performed, and patient medications and                            allergies were reviewed. The patient's tolerance of                            previous  anesthesia was also reviewed. The risks                            and benefits of the procedure and the sedation                            options and risks were discussed with the patient.                            All questions were answered, and informed consent                            was obtained. Prior Anticoagulants: The patient has                            taken no previous anticoagulant or antiplatelet                            agents. ASA Grade Assessment: II - A patient with                            mild systemic disease. After reviewing the risks                            and benefits, the patient was deemed in                            satisfactory condition to undergo the procedure.                           After obtaining informed consent, the colonoscope                            was passed under direct vision. Throughout the                            procedure, the patient's blood pressure, pulse, and  oxygen saturations were monitored continuously. The                            procedure was aborted. The colonscope was not                            inserted. Medications were given. The colonoscopy                            was aborted due to poor bowel prep. No anatomical                            landmarks were photographed. The quality of the                            bowel preparation was poor. Scope In: Scope Out: Findings:      Just before performing a rectal exam, the patient began passing a large       amount of thick stool with some solid material.      Since the preparation was clearly inadequate, the scope was not inserted       and the procedure aborted. Impression:               - The procedure was aborted due to poor bowel prep.                           - No specimens collected. Moderate Sedation:      MAC sedation used Recommendation:           - Resume regular diet.                           - Discharge  patient to home.                           - Outpatient colonoscopy with Dr. Carlean Purl.                           - Continue present medications.                           - Repeat colonoscopy at appointment to be scheduled. Procedure Code(s):         Diagnosis Code(s):        --- Professional ---                           D50.9, Iron deficiency anemia, unspecified CPT copyright 2016 American Medical Association. All rights reserved. The codes documented in this report are preliminary and upon coder review may  be revised to meet current compliance requirements.  L. Loletha Carrow, MD 01/09/2017 10:35:23 AM This report has been signed electronically. Number of Addenda: 0

## 2017-01-09 NOTE — Anesthesia Procedure Notes (Signed)
Procedure Name: MAC Date/Time: 01/09/2017 10:13 AM Performed by: Lance Coon, CRNA Pre-anesthesia Checklist: Patient identified, Emergency Drugs available, Suction available, Patient being monitored and Timeout performed Patient Re-evaluated:Patient Re-evaluated prior to induction Oxygen Delivery Method: Nasal cannula

## 2017-01-09 NOTE — Anesthesia Postprocedure Evaluation (Signed)
Anesthesia Post Note  Patient: Ashley Singleton  Procedure(s) Performed: ESOPHAGOGASTRODUODENOSCOPY (EGD) (N/A )     Patient location during evaluation: PACU Anesthesia Type: MAC Level of consciousness: awake and alert Pain management: pain level controlled Vital Signs Assessment: post-procedure vital signs reviewed and stable Respiratory status: spontaneous breathing and respiratory function stable Cardiovascular status: stable Postop Assessment: no apparent nausea or vomiting Anesthetic complications: no    Last Vitals:  Vitals:   01/09/17 1044 01/09/17 1050  BP: (!) 151/68 (!) 151/68  Pulse: 66 69  Resp: 16 (!) 21  Temp: 36.4 C   SpO2: 100% 100%    Last Pain:  Vitals:   01/09/17 1044  TempSrc: Oral  PainSc:                  Aycen Porreca DANIEL

## 2017-01-09 NOTE — Progress Notes (Signed)
Ginnie Smart to be D/C'd Home per MD order.  Discussed prescriptions and follow up appointments with the patient. Prescriptions given to patient, medication list explained in detail. Pt verbalized understanding.  Allergies as of 01/09/2017      Reactions   Shrimp [shellfish Allergy] Itching      Medication List    TAKE these medications   ferrous sulfate 325 (65 FE) MG tablet Take 1 tablet (325 mg total) by mouth daily with breakfast. Start taking on:  01/10/2017   ibuprofen 200 MG tablet Commonly known as:  ADVIL,MOTRIN Take 200-400 mg by mouth every 6 (six) hours as needed (for headaches).       Vitals:   01/09/17 1044 01/09/17 1050  BP: (!) 151/68 (!) 151/68  Pulse: 66 69  Resp: 16 (!) 21  Temp: 97.6 F (36.4 C)   SpO2: 100% 100%    Skin clean, dry and intact without evidence of skin break down, no evidence of skin tears noted. IV catheter discontinued intact. Site without signs and symptoms of complications. Dressing and pressure applied. Pt denies pain at this time. No complaints noted.  An After Visit Summary was printed and given to the patient. Patient escorted via [place], and D/C home via private auto.  Chapman Fitch BSN, RN

## 2017-01-09 NOTE — Interval H&P Note (Signed)
History and Physical Interval Note:  01/09/2017 9:55 AM  Ashley Singleton  has presented today for surgery, with the diagnosis of iron deficiency anemia, hemoccult positive stools  The various methods of treatment have been discussed with the patient and family. After consideration of risks, benefits and other options for treatment, the patient has consented to  Procedure(s): ESOPHAGOGASTRODUODENOSCOPY (EGD) (N/A) COLONOSCOPY (N/A) as a surgical intervention .  The patient's history has been reviewed, patient examined, no change in status, stable for surgery.  I have reviewed the patient's chart and labs.  Questions were answered to the patient's satisfaction.     Nelida Meuse III

## 2017-01-09 NOTE — Discharge Summary (Signed)
Name: Ashley Singleton MRN: [id number] DOB: [birth year] 46 y.o. PCP: Crawford Givens, MD  Date of Admission: 01/07/2017  6:51 AM Date of Discharge:  Attending Physician: Sid Falcon, MD  Discharge Diagnosis: 1. Symptomatic Anemia Active Problems:   Symptomatic anemia   Heme + stool   Iron deficiency anemia due to chronic blood loss   Discharge Medications: Allergies as of 01/09/2017      Reactions   Shrimp [shellfish Allergy] Itching      Medication List    TAKE these medications   ferrous sulfate 325 (65 FE) MG tablet Take 1 tablet (325 mg total) by mouth daily with breakfast. Start taking on:  01/10/2017   ibuprofen 200 MG tablet Commonly known as:  ADVIL,MOTRIN Take 200-400 mg by mouth every 6 (six) hours as needed (for headaches).       Disposition and follow-up:   AshleyAshley Singleton was discharged from [facility] in Good condition.  At the hospital follow up visit please address:  1.    Symptomatic Anemia  -Hgb 7.7 on admission, 8.6 at d/c; FOBT + -Did not require blood transfusion -Received 1 x IV Iron (Feraheme) -EGD negative for esophageal or stomach abnormality -Unable to perform colonoscopy due to poor prep -Will need repeat CBC at follow up -Follow up with GI? Colonoscopy results?  -Follow up with Ob/gyn for fibroids and menorrhagia? -D/c on PO Iron; tolerating? Side effects? Cost issues?   Health Maintenance -Given Flu vaccination prior to d/c -Will not cervical cancer screening as patient has not had pap since childbirth 8 years ago   2.  Labs / imaging needed at time of follow-up: CBC, Colonoscopy, PAP Smear   3.  Pending labs/ test needing follow-up: None   Follow-up Appointments:   Hospital Course by problem list: Active Problems:   Symptomatic anemia   Heme + stool   Iron deficiency anemia due to chronic blood loss   1. Symptomatic Anemia  Ashley Singleton was admitted to [facility] and the Internal  Medicine Teaching service for symptomatic anemia after a syncopal like episode at work. Upon arrival to the ED, her vitals signs were stable. Her hemoglobin was 7.7 on admission, with a positive FOBT. She was started on an IV PPI.  She was also found to have iron deficiency anemia. She did not require blood transfusion and received one dose of IV iron. GI was consulted and performed an EGD, which was normal. The colonoscopy could not be performed due to poor prep and she will rescheduled to follow up with [facility] as an outpatient. Her hemoglobin remained stable and was 8.6 on discharge. She was discharged on PO iron.  She was instructed to follow up with [facility] to establish care. An ambulatory referral for Ob/Gyn was also placed as the patient was experiencing menorrhagia and had a history of fibroids. She also has not had a pap smear since the birth of her daughter 8 years ago.    Discharge Vitals:   BP (!) 151/68   Pulse 69   Temp 97.6 F (36.4 C) (Oral)   Resp (!) 21   Ht 5\' 3"  (1.6 m)   Wt 182 lb (82.6 kg)   LMP 01/05/2017   SpO2 100%   BMI 32.24 kg/m   Pertinent Labs, Studies, and Procedures:  CBC Latest Ref Rng & Units 01/09/2017 01/08/2017 01/07/2017  WBC 4.0 - 10.5 K/uL 2.7(L) 2.1(L) 2.5(L)  Hemoglobin 12.0 -  15.0 g/dL 8.6(L) 7.9(L) 8.5(L)  Hematocrit 36.0 - 46.0 % 29.4(L) 27.1(L) 29.1(L)  Platelets 150 - 400 K/uL 313 320 322   BMP Latest Ref Rng & Units 01/09/2017 01/08/2017 01/07/2017  Glucose 65 - 99 mg/dL 108(H) 98 104(H)  BUN 6 - 20 mg/dL <5(L) 5(L) 7  Creatinine 0.44 - 1.00 mg/dL 0.70 0.69 0.77  Sodium 135 - 145 mmol/L 138 135 134(L)  Potassium 3.5 - 5.1 mmol/L 3.7 3.7 3.2(L)  Chloride 101 - 111 mmol/L 108 107 102  CO2 22 - 32 mmol/L 22 24 24   Calcium 8.9 - 10.3 mg/dL 8.8(L) 8.5(L) 8.5(L)    Discharge Instructions: Discharge Instructions    Ambulatory referral to Obstetrics / Gynecology   Complete by:  As directed    Patient  with symptomatic anemia. History of fibroids. GI work up incomplete thus far, but EGD negative. Will also need cervical cancer screening.   Call MD for:  difficulty breathing, headache or visual disturbances   Complete by:  As directed    Call MD for:  extreme fatigue   Complete by:  As directed    Call MD for:  persistant dizziness or light-headedness   Complete by:  As directed    Diet - low sodium heart healthy   Complete by:  As directed    Discharge instructions   Complete by:  As directed    Ashley Singleton,   You were admitted for having a low blood, also called anemia. Labs showed that you had severe iron deficiency anemia. We replaced your body's iron stores with IV iron. This will slowly help your body make red blood cells over the next month or so. You were also started on oral Iron supplement pills. I have prescribed these for you. Take 1 tablet a day. Iron tablets can darken stool color, cause abdominal pain, and constipation.   The camera that looked into your stomach did not show any evidence of bleed and was completely normal. Unfortunately, the colonoscopy could not be performed because of inaqdequate prep and visualization would have been poor. You will be scheduled for a repeat colonoscopy with Dr. Carlean Purl as an outpatient. I highly recommend you follow up and have this procedure completed. [facility] will be contacting you regarding an appoinment and scheduling of your colonoscopy.   I will also refer you to and Ob/Gyn for follow up of your fibroids and heavy periods as the possible cause of your anemia. You are also due for cervical cancer screening, which I highly recommend.  Below is information regarding [facility]. Please schedule a hospital follow up with them within 1-2 weeks of discharge. You will need to get repeat labs to check your blood count. They can also help you with other primary care needs.   [place], [place] [address]  Main Phone #: [phone]   Increase activity slowly   Complete by:  As directed       Signed: Melanee Spry, MD 01/09/2017, 1:32 PM   Pager: [phone]

## 2017-01-09 NOTE — Anesthesia Preprocedure Evaluation (Addendum)
Anesthesia Evaluation  Patient identified by MRN, date of birth, ID band Patient awake    Reviewed: Allergy & Precautions, NPO status , Patient's Chart, lab work & pertinent test results  History of Anesthesia Complications Negative for: history of anesthetic complications  Airway Mallampati: II  TM Distance: >3 FB Neck ROM: Full    Dental no notable dental hx. (+) Dental Advisory Given   Pulmonary neg pulmonary ROS,    Pulmonary exam normal        Cardiovascular negative cardio ROS Normal cardiovascular exam     Neuro/Psych negative neurological ROS  negative psych ROS   GI/Hepatic Neg liver ROS,   Endo/Other  negative endocrine ROS  Renal/GU negative Renal ROS     Musculoskeletal negative musculoskeletal ROS (+)   Abdominal   Peds  Hematology  (+) anemia ,   Anesthesia Other Findings Day of surgery medications reviewed with the patient.  Reproductive/Obstetrics                            Anesthesia Physical Anesthesia Plan  ASA: II  Anesthesia Plan: MAC   Post-op Pain Management:    Induction:   PONV Risk Score and Plan: 2 and Ondansetron and Propofol infusion  Airway Management Planned: Natural Airway and Simple Face Mask  Additional Equipment:   Intra-op Plan:   Post-operative Plan:   Informed Consent: I have reviewed the patients History and Physical, chart, labs and discussed the procedure including the risks, benefits and alternatives for the proposed anesthesia with the patient or authorized representative who has indicated his/her understanding and acceptance.   Dental advisory given  Plan Discussed with: CRNA and Anesthesiologist  Anesthesia Plan Comments:        Anesthesia Quick Evaluation

## 2017-01-09 NOTE — Care Management Note (Signed)
Case Management Note  Patient Details  Name: Ashley Singleton MRN: [id number] Date of Birth: 14-Oct-1970  Subjective/Objective:                  Admitted with symptomatic anemia.        s/p EGD and colonoscopy   Delois Hilyer (Mother)     [phone]       PCP: Crawford Givens  Action/Plan: Transition to home today.  Expected Discharge Date:  01/09/17               Expected Discharge Plan:  Home/Self Care  In-House Referral:     Discharge planning Services  CM Consult  Post Acute Care Choice:    Choice offered to:     DME Arranged:    DME Agency:     HH Arranged:    HH Agency:     Status of Service:  Completed, signed off  If discussed at [employer] of Stay Meetings, dates discussed:    Additional Comments:  Sharin Mons, RN 01/09/2017, 12:31 PM

## 2017-01-09 NOTE — Progress Notes (Signed)
   Subjective:  Patient seen and examined. Doing well this morning, no acute complaints; tolerated IV iron well. She states she is worrying about what the EGD/colo will find today. She completes the prep without much difficulty. Denies CP, SOB, or lightheaded/dizziness upon standing.   Discussed with patient she will need to follow up with Ob/gyn if scopes are negative, but should follow up regardless because she will also need cervical cancer screening. She does not have a PCP and we discussed [facility], which she is agreeable to follow up with.   Objective:  Vital signs in last 24 hours: Vitals:   01/08/17 1113 01/08/17 2032 01/09/17 0552 01/09/17 0734  BP: 110/74 (!) 121/54 133/69 101/62  Pulse: 77 66 66 62  Resp: 18 19 18 18   Temp: 98.3 F (36.8 C) 98.5 F (36.9 C) 98.2 F (36.8 C) 98.2 F (36.8 C)  TempSrc: Oral Oral Oral Oral  SpO2: 100% 97% 99% 99%  Weight:  182 lb 5.1 oz (82.7 kg)    Height:       General: Laying in bed comfortably, NAD HEENT: [place]/AT, EOMI, no scleral icterus, +conjuctival pallor Cardiac: RRR, 2/6 systolic murmur Pulm: normal effort, CTAB in anterior lung fields Abd: soft, non tender, non distended, BS normal Ext: extremities well perfused, no peripheral edema Neuro: alert and oriented X3, cranial nerves II-XII grossly intact   Assessment/Plan:  Active Problems:   Symptomatic anemia   Heme + stool   Iron deficiency anemia due to chronic blood loss  Symptomatic Anemia Microcytic anemia, Iron deficiency anemia Hgb 8.6, MCV 67 this AM. Patient's anemia may be multifactorial: Frequent NSAID use increases her risk of an upper GI bleed, but BUN WNL. History of fibroids and heavy menstrual cycles could also be contributing. EGD completed and was normal. Colonscopy procedure had to be aborted due to inadequate prep, is scheduled with Dr. Carlean Purl for repeat colonoscopy.  -GI s/o, okay with discharging the patient  -Will need outpatient  referral to OB/gyn for work up or menorrhagia as cause of anemia; will also need PAP  -Continue PO iron as outpatient -will d/c IV PPI as EGD was negative   Leukopenia WBC of < 3. B12 and folate within normal limits. Unclear etiology.  -Will continue to monitor with CBCs   Dispo: Anticipated discharge today.  Melanee Spry, MD 01/09/2017, 9:29 AM Pager: [phone]

## 2017-01-09 NOTE — Op Note (Signed)
Ashley Singleton Patient Name: Ashley Singleton Procedure Date : 01/09/2017 MRN: [id number] Attending MD: Estill Cotta. Loletha Carrow , MD Date of Birth: 1970/06/07 CSN: [id number] Age: 46 Admit Type: Inpatient Procedure:                Upper GI endoscopy Indications:              Iron deficiency anemia, Heme positive stool Providers:                Mallie Mussel L. Loletha Carrow, MD, Kingsley Plan, RN, Cletis Athens, Technician Referring MD:              Medicines:                Monitored Anesthesia Care Complications:            No immediate complications. Estimated Blood Loss:     Estimated blood loss: none. Procedure:                Pre-Anesthesia Assessment:                           - Prior to the procedure, a History and Physical                            was performed, and patient medications and                            allergies were reviewed. The patient's tolerance of                            previous anesthesia was also reviewed. The risks                            and benefits of the procedure and the sedation                            options and risks were discussed with the patient.                            All questions were answered, and informed consent                            was obtained. Prior Anticoagulants: The patient has                            taken no previous anticoagulant or antiplatelet                            agents. ASA Grade Assessment: II - A patient with                            mild systemic disease. After reviewing the risks  and benefits, the patient was deemed in                            satisfactory condition to undergo the procedure.                           After obtaining informed consent, the endoscope was                            passed under direct vision. Throughout the                            procedure, the patient's blood pressure, pulse, and                            oxygen  saturations were monitored continuously. The                            was introduced through the mouth, and advanced to                            the second part of duodenum. The upper GI endoscopy                            was accomplished without difficulty. The patient                            tolerated the procedure well. Scope In: Scope Out: Findings:      The esophagus was normal.      The stomach was normal.      The cardia and gastric fundus were normal on retroflexion.      The examined duodenum was normal. Impression:               - Normal esophagus.                           - Normal stomach.                           - Normal examined duodenum.                           - No specimens collected. Moderate Sedation:      MAC sedation used Recommendation:           - Resume previous diet.                           - See the other procedure note for documentation of                            additional recommendations.                           - Continue present medications. Procedure Code(s):        --- Professional ---  91694, Esophagogastroduodenoscopy, flexible,                            transoral; diagnostic, including collection of                            specimen(s) by brushing or washing, when performed                            (separate procedure) Diagnosis Code(s):        --- Professional ---                           D50.9, Iron deficiency anemia, unspecified                           R19.5, Other fecal abnormalities CPT copyright 2016 American Medical Association. All rights reserved. The codes documented in this report are preliminary and upon coder review may  be revised to meet current compliance requirements. [name] L. Loletha Carrow, MD 01/09/2017 10:29:57 AM This report has been signed electronically. Number of Addenda: 0

## 2017-01-09 NOTE — Transfer of Care (Signed)
Immediate Anesthesia Transfer of Care Note  Patient: Ashley Singleton  Procedure(s) Performed: ESOPHAGOGASTRODUODENOSCOPY (EGD) (N/A )  Patient Location: PACU and Endoscopy Unit  Anesthesia Type:MAC  Level of Consciousness: awake and patient cooperative  Airway & Oxygen Therapy: Patient Spontanous Breathing  Post-op Assessment: Report given to RN and Post -op Vital signs reviewed and stable  Post vital signs: Reviewed and stable  Last Vitals:  Vitals:   01/09/17 0936 01/09/17 1033  BP: (!) 145/81 112/66  Pulse: 66   Resp: 14 18  Temp: 36.9 C   SpO2: 97% 98%    Last Pain:  Vitals:   01/09/17 0936  TempSrc: Oral  PainSc:          Complications: No apparent anesthesia complications

## 2017-01-10 ENCOUNTER — Encounter (HOSPITAL_COMMUNITY): Payer: Self-pay | Admitting: Gastroenterology

## 2017-01-18 ENCOUNTER — Telehealth: Payer: Self-pay

## 2017-01-18 NOTE — Telephone Encounter (Signed)
Patient is scheduled for pre-visit 01/19/17 and colon in the [facility] 01/25/17

## 2017-01-18 NOTE — Telephone Encounter (Signed)
Left message for patient to call back  

## 2017-01-18 NOTE — Telephone Encounter (Signed)
-----   Message from Gatha Mayer, MD sent at 01/09/2017 12:20 PM EST ----- Regarding: needs colonoscopy direct appointment Thanks  We will call her after Xmas and schedule direct colonoscopy   ----- Message ----- From: Doran Stabler, MD Sent: 01/09/2017  11:01 AM To: Gatha Mayer, MD  EGD nml  Not prepped- could not do colon.  Not going to keep her over holiday for colonoscopy.  Despite uninsured status, should be done outpatient.  - HD

## 2017-01-19 ENCOUNTER — Ambulatory Visit (AMBULATORY_SURGERY_CENTER): Payer: Self-pay

## 2017-01-19 ENCOUNTER — Other Ambulatory Visit: Payer: Self-pay

## 2017-01-19 VITALS — Ht 65.0 in | Wt 171.2 lb

## 2017-01-19 DIAGNOSIS — D509 Iron deficiency anemia, unspecified: Secondary | ICD-10-CM

## 2017-01-19 MED ORDER — PEG-KCL-NACL-NASULF-NA ASC-C 140 G PO SOLR: 1.0000 | Freq: Once | ORAL | 0 refills | Status: AC

## 2017-01-19 NOTE — Progress Notes (Signed)
Denies allergies to eggs or soy products. Denies complication of anesthesia or sedation. Denies use of weight loss medication. Denies use of O2.   Emmi instructions given for colonoscopy.  Patient was initially prepped with a two day Miralax prep since she was not cleaned out for the first attempt to have her colonoscopy in the hospital with Dr. Loletha Carrow. After much explaining Jorgina was not able to follow instructions, therefore a sample of Plenvu was given to her. I felt that it would be a few less steps. Patient seemed to have a better understanding of Plenvu. An hour and 20 minutes were spent with her explaining preps and instructions. Patient was very appreciative of the time spent with her during PV.   Riki Sheer, LPN  ( PV )

## 2017-01-20 ENCOUNTER — Telehealth: Payer: Self-pay | Admitting: Internal Medicine

## 2017-01-20 NOTE — Telephone Encounter (Signed)
Patient notified to hold the iron as of today

## 2017-01-25 ENCOUNTER — Ambulatory Visit (AMBULATORY_SURGERY_CENTER): Payer: Self-pay | Admitting: Internal Medicine

## 2017-01-25 ENCOUNTER — Other Ambulatory Visit: Payer: Self-pay

## 2017-01-25 ENCOUNTER — Inpatient Hospital Stay: Payer: Self-pay

## 2017-01-25 ENCOUNTER — Encounter: Payer: Self-pay | Admitting: Internal Medicine

## 2017-01-25 ENCOUNTER — Other Ambulatory Visit (INDEPENDENT_AMBULATORY_CARE_PROVIDER_SITE_OTHER): Payer: Self-pay

## 2017-01-25 VITALS — BP 124/79 | HR 70 | Temp 97.1°F | Resp 18 | Ht 65.0 in | Wt 171.0 lb

## 2017-01-25 DIAGNOSIS — D509 Iron deficiency anemia, unspecified: Secondary | ICD-10-CM

## 2017-01-25 DIAGNOSIS — D500 Iron deficiency anemia secondary to blood loss (chronic): Secondary | ICD-10-CM

## 2017-01-25 LAB — CBC
HCT: 39 % (ref 36.0–46.0)
HEMOGLOBIN: 11.7 g/dL — AB (ref 12.0–15.0)
MCHC: 29.9 g/dL — AB (ref 30.0–36.0)
MCV: 75 fl — ABNORMAL LOW (ref 78.0–100.0)
Platelets: 380 10*3/uL (ref 150.0–400.0)
RBC: 5.2 Mil/uL — ABNORMAL HIGH (ref 3.87–5.11)
RDW: 31.2 % — AB (ref 11.5–15.5)
WBC: 6 10*3/uL (ref 4.0–10.5)

## 2017-01-25 MED ORDER — SODIUM CHLORIDE 0.9 % IV SOLN: 500.0000 mL | Freq: Once | INTRAVENOUS | Status: DC

## 2017-01-25 NOTE — Patient Instructions (Addendum)
The colonoscopy was normal.  I think your iron is low and you are anemic from menstrual period blood loss.  I am going to recheck your blood count today.  Restart ferrous sulfate (iron) and follow-up with gynecologist about menstrual periods and blood loss.  I appreciate the opportunity to care for you. Gatha Mayer, MD, FACG   YOU HAD AN ENDOSCOPIC PROCEDURE TODAY AT [facility]:   Refer to the procedure report that was given to you for any specific questions about what was found during the examination.  If the procedure report does not answer your questions, please call your gastroenterologist to clarify.  If you requested that your care partner not be given the details of your procedure findings, then the procedure report has been included in a sealed envelope for you to review at your convenience later.  YOU SHOULD EXPECT: Some feelings of bloating in the abdomen. Passage of more gas than usual.  Walking can help get rid of the air that was put into your GI tract during the procedure and reduce the bloating. If you had a lower endoscopy (such as a colonoscopy or flexible sigmoidoscopy) you may notice spotting of blood in your stool or on the toilet paper. If you underwent a bowel prep for your procedure, you may not have a normal bowel movement for a few days.  Please Note:  You might notice some irritation and congestion in your nose or some drainage.  This is from the oxygen used during your procedure.  There is no need for concern and it should clear up in a day or so.  SYMPTOMS TO REPORT IMMEDIATELY:   Following lower endoscopy (colonoscopy or flexible sigmoidoscopy):  Excessive amounts of blood in the stool  Significant tenderness or worsening of abdominal pains  Swelling of the abdomen that is new, acute  Fever of 100F or higher    For urgent or emergent issues, a gastroenterologist can be reached at any hour by calling [phone].   DIET:   We do recommend a small meal at first, but then you may proceed to your regular diet.  Drink plenty of fluids but you should avoid alcoholic beverages for 24 hours.  ACTIVITY:  You should plan to take it easy for the rest of today and you should NOT DRIVE or use heavy machinery until tomorrow (because of the sedation medicines used during the test).    FOLLOW UP: Our staff will call the number listed on your records the next business day following your procedure to check on you and address any questions or concerns that you may have regarding the information given to you following your procedure. If we do not reach you, we will leave a message.  However, if you are feeling well and you are not experiencing any problems, there is no need to return our call.  We will assume that you have returned to your regular daily activities without incident.  If any biopsies were taken you will be contacted by phone or by letter within the next 1-3 weeks.  Please call us at [phone] if you have not heard about the biopsies in 3 weeks.    SIGNATURES/CONFIDENTIALITY: You and/or your care partner have signed paperwork which will be entered into your electronic medical record.  These signatures attest to the fact that that the information above on your After Visit Summary has been reviewed and is understood.  Full responsibility of the confidentiality of this discharge  information lies with you and/or your care-partner. 

## 2017-01-25 NOTE — Progress Notes (Signed)
Pt states no soy or egg allergy Pt's states no medical or surgical changes since previsit or office visit.

## 2017-01-25 NOTE — Op Note (Signed)
[facility] Patient Name: Ashley Singleton Procedure Date: 01/25/2017 2:20 PM MRN: [id number] Endoscopist: Gatha Mayer , MD Age: 47 Referring MD:  Date of Birth: Aug 23, 1970 Gender: Female Account #: [id number] Procedure:                Colonoscopy Indications:              Unexplained iron deficiency anemia Medicines:                Propofol per Anesthesia, Monitored Anesthesia Care Procedure:                Pre-Anesthesia Assessment:                           - Prior to the procedure, a History and Physical                            was performed, and patient medications and                            allergies were reviewed. The patient's tolerance of                            previous anesthesia was also reviewed. The risks                            and benefits of the procedure and the sedation                            options and risks were discussed with the patient.                            All questions were answered, and informed consent                            was obtained. Prior Anticoagulants: The patient has                            taken no previous anticoagulant or antiplatelet                            agents. ASA Grade Assessment: II - A patient with                            mild systemic disease. After reviewing the risks                            and benefits, the patient was deemed in                            satisfactory condition to undergo the procedure.                           After obtaining informed consent, the colonoscope  was passed under direct vision. Throughout the                            procedure, the patient's blood pressure, pulse, and                            oxygen saturations were monitored continuously. The                            Colonoscope was introduced through the anus and                            advanced to the the cecum, identified by                            appendiceal  orifice and ileocecal valve. The                            quality of the bowel preparation was excellent. The                            colonoscopy was performed without difficulty. The                            patient tolerated the procedure well. The bowel                            preparation used was Miralax. The ileocecal valve,                            appendiceal orifice, and rectum were photographed. Scope In: 2:33:28 PM Scope Out: 2:48:50 PM Scope Withdrawal Time: 0 hours 7 minutes 39 seconds  Total Procedure Duration: 0 hours 15 minutes 22 seconds  Findings:                 The perianal and digital rectal examinations were                            normal.                           The colon (entire examined portion) appeared normal.                           No additional abnormalities were found on                            retroflexion. Complications:            No immediate complications. Estimated blood loss:                            None. Estimated Blood Loss:     Estimated blood loss: none. Recommendation:           - Repeat colonoscopy in 10 years for screening  purposes.                           - Patient has a contact number available for                            emergencies. The signs and symptoms of potential                            delayed complications were discussed with the                            patient. Return to normal activities tomorrow.                            Written discharge instructions were provided to the                            patient.                           - Resume previous diet.                           - Continue present medications.                           - I have ordered CBC for today                           She needs to restart ferrous sulfate (help for                            colonoscopy)                           She should see GYN re: menorrhagia Gatha Mayer,  MD 01/25/2017 2:57:10 PM This report has been signed electronically.

## 2017-01-26 ENCOUNTER — Telehealth: Payer: Self-pay

## 2017-01-26 NOTE — Telephone Encounter (Signed)
Pt returned call and said she is doing good 

## 2017-01-26 NOTE — Telephone Encounter (Signed)
Left message

## 2017-01-26 NOTE — Telephone Encounter (Signed)
  Follow up Call-  Call back number 01/25/2017  Post procedure Call Back phone  # [phone]  Permission to leave phone message Yes  Some recent data might be hidden     Left message

## 2017-01-26 NOTE — Progress Notes (Signed)
Let her know hgb much better - almost normal at 11.7  1) Stay on ferrous sulfate - qd - may refill if she wants 2) She should see her GYN about menstrual periods and blood loss as we discussed - she has seen Dr. Charlesetta Garibaldi in past 3) F/U GI prn

## 2017-02-01 ENCOUNTER — Encounter: Payer: Self-pay | Admitting: Internal Medicine

## 2017-02-01 ENCOUNTER — Ambulatory Visit: Payer: Self-pay | Attending: Internal Medicine | Admitting: Internal Medicine

## 2017-02-01 VITALS — BP 120/78 | HR 95 | Temp 98.0°F | Resp 16 | Wt 174.2 lb

## 2017-02-01 DIAGNOSIS — N920 Excessive and frequent menstruation with regular cycle: Secondary | ICD-10-CM

## 2017-02-01 DIAGNOSIS — D509 Iron deficiency anemia, unspecified: Secondary | ICD-10-CM | POA: Insufficient documentation

## 2017-02-01 MED ORDER — FERROUS SULFATE 325 (65 FE) MG PO TABS: 325.0000 mg | ORAL_TABLET | Freq: Every day | ORAL | 1 refills | Status: DC

## 2017-02-01 MED FILL — FERROUS SULFATE 325 MG TAB: 325 (65 FE) | 30 days supply | Qty: 30 | Fill #0

## 2017-02-01 NOTE — Patient Instructions (Signed)
We will set you up with GYN

## 2017-02-01 NOTE — Progress Notes (Signed)
Pt seen as a new patient. Hospitalized recently for Anemia: hgb= 7.7. Had endoscopy- normal.  Referred for outpatient colonoscopyf (normal) and outpatient OB exam. It does not appear that she has followed up with OB.  She has regular periods- 6 days of bleeding and will go through 8 pads per day.   Past Medical History:  Diagnosis Date  . Allergy   . Anemia   . Fibroids     Social History   Socioeconomic History  . Marital status: Single    Spouse name: Not on file  . Number of children: 1  . Years of education: Not on file  . Highest education level: Not on file  Social Needs  . Financial resource strain: Not on file  . Food insecurity - worry: Not on file  . Food insecurity - inability: Not on file  . Transportation needs - medical: Not on file  . Transportation needs - non-medical: Not on file  Occupational History    Employer: [employer]  Tobacco Use  . Smoking status: Never Smoker  . Smokeless tobacco: Never Used  Substance and Sexual Activity  . Alcohol use: No  . Drug use: No  . Sexual activity: No    Birth control/protection: None  Other Topics Concern  . Not on file  Social History Narrative  . Not on file    Past Surgical History:  Procedure Laterality Date  . COLONOSCOPY    . ESOPHAGOGASTRODUODENOSCOPY N/A 01/09/2017   Procedure: ESOPHAGOGASTRODUODENOSCOPY (EGD);  Surgeon: Doran Stabler, MD;  Location: [facility] ENDOSCOPY;  Service: Endoscopy;  Laterality: N/A;  . TONSILLECTOMY      Family History  Problem Relation Age of Onset  . Diabetes Mother   . Multiple myeloma Maternal Grandmother   . Prostate cancer Maternal Grandfather   . Pancreatic cancer Paternal Grandfather   . Colon cancer Neg Hx   . Esophageal cancer Neg Hx   . Rectal cancer Neg Hx   . Stomach cancer Neg Hx     Allergies  Allergen Reactions  . Shrimp [Shellfish Allergy] Itching      patient denies chest pain, shortness of breath, orthopnea. Denies lower extremity  edema, abdominal pain, change in appetite, change in bowel movements. Patient denies rashes, musculoskeletal complaints. No other specific complaints in a complete review of systems.   BP 120/78   Pulse 95   Temp 98 F (36.7 C) (Oral)   Resp 16   Wt 174 lb 3.2 oz (79 kg)   LMP 01/05/2017   SpO2 97%   BMI 28.99 kg/m    Well-developed well-nourished female in no acute distress. HEENT exam atraumatic, normocephalic, extraocular muscles are intact. Neck is supple. No jugular venous distention no thyromegaly. Chest clear to auscultation without increased work of breathing. Cardiac exam S1 and S2 are regular. Abdominal exam active bowel sounds, soft, nontender. Extremities no edema. Neurologic exam she is alert without any motor sensory deficits. Gait is normal.   A/P- anemia, iron deficiency. Almost certainly menorrhagia related. Needs GYN appt.   Continue oral iron for 2 months.

## 2017-02-05 ENCOUNTER — Other Ambulatory Visit: Payer: Self-pay | Admitting: Internal Medicine

## 2017-02-06 ENCOUNTER — Telehealth: Payer: Self-pay | Admitting: Obstetrics and Gynecology

## 2017-02-06 NOTE — Telephone Encounter (Signed)
Patient called back and declined to schedule an appointment with our office. She requested I send the referral back to the referring office so she can follow up with them.

## 2017-02-06 NOTE — Telephone Encounter (Signed)
Called and left a message for patient to call back to schedule a new patient doctor referral appointment with our office for: Menorrhagia with regular cycle.

## 2017-02-13 ENCOUNTER — Encounter: Payer: Self-pay | Admitting: Obstetrics and Gynecology

## 2017-03-03 ENCOUNTER — Encounter: Payer: Self-pay | Admitting: Family Medicine

## 2017-03-03 ENCOUNTER — Ambulatory Visit: Payer: Self-pay | Attending: Family Medicine | Admitting: Family Medicine

## 2017-03-03 VITALS — BP 122/83 | HR 83 | Temp 98.5°F | Resp 16 | Ht 65.5 in | Wt 179.8 lb

## 2017-03-03 DIAGNOSIS — Z79899 Other long term (current) drug therapy: Secondary | ICD-10-CM | POA: Insufficient documentation

## 2017-03-03 DIAGNOSIS — R51 Headache: Secondary | ICD-10-CM | POA: Insufficient documentation

## 2017-03-03 DIAGNOSIS — D259 Leiomyoma of uterus, unspecified: Secondary | ICD-10-CM | POA: Insufficient documentation

## 2017-03-03 DIAGNOSIS — N920 Excessive and frequent menstruation with regular cycle: Secondary | ICD-10-CM | POA: Insufficient documentation

## 2017-03-03 DIAGNOSIS — Z86018 Personal history of other benign neoplasm: Secondary | ICD-10-CM

## 2017-03-03 DIAGNOSIS — D509 Iron deficiency anemia, unspecified: Secondary | ICD-10-CM | POA: Insufficient documentation

## 2017-03-03 DIAGNOSIS — Z7689 Persons encountering health services in other specified circumstances: Secondary | ICD-10-CM | POA: Insufficient documentation

## 2017-03-03 DIAGNOSIS — Z862 Personal history of diseases of the blood and blood-forming organs and certain disorders involving the immune mechanism: Secondary | ICD-10-CM

## 2017-03-03 NOTE — Patient Instructions (Addendum)
Apply for orange card and discount.   Menorrhagia Menorrhagia is a condition in which menstrual periods are heavy or last longer than normal. With menorrhagia, most periods a woman has may cause enough blood loss and cramping that she becomes unable to take part in her usual activities. What are the causes? Common causes of this condition include:  Noncancerous growths in the uterus (polyps or fibroids).  An imbalance of the estrogen and progesterone hormones.  One of the ovaries not releasing an egg during one or more months.  A problem with the thyroid gland (hypothyroid).  Side effects of having an intrauterine device (IUD).  Side effects of some medicines, such as anti-inflammatory medicines or blood thinners.  A bleeding disorder that stops the blood from clotting normally.  In some cases, the cause of this condition is not known. What are the signs or symptoms? Symptoms of this condition include:  Routinely having to change your pad or tampon every 1-2 hours because it is completely soaked.  Needing to use pads and tampons at the same time because of heavy bleeding.  Needing to wake up to change your pads or tampons during the night.  Passing blood clots larger than 1 inch (2.5 cm) in size.  Having bleeding that lasts for more than 7 days.  Having symptoms of low iron levels (anemia), such as tiredness, fatigue, or shortness of breath.  How is this diagnosed? This condition may be diagnosed based on:  A physical exam.  Your symptoms and menstrual history.  Tests, such as: ? Blood tests to check if you are pregnant or have hormonal changes, a bleeding or thyroid disorder, anemia, or other problems. ? Pap test to check for cancerous changes, infections, or inflammation. ? Endometrial biopsy. This test involves removing a tissue sample from the lining of the uterus (endometrium) to be examined under a microscope. ? Pelvic ultrasound. This test uses sound waves to  create images of your uterus, ovaries, and vagina. The images can show if you have fibroids or other growths. ? Hysteroscopy. For this test, a small telescope is used to look inside your uterus.  How is this treated? Treatment may not be needed for this condition. If it is needed, the best treatment for you will depend on:  Whether you need to prevent pregnancy.  Your desire to have children in the future.  The cause and severity of your bleeding.  Your personal preference.  Medicines are the first step in treatment. You may be treated with:  Hormonal birth control methods. These treatments reduce bleeding during your menstrual period. They include: ? Birth control pills. ? Skin patch. ? Vaginal ring. ? Shots (injections) that you get every 3 months. ? Hormonal IUD (intrauterine device). ? Implants that go under the skin.  Medicines that thicken blood and slow bleeding.  Medicines that reduce swelling, such as ibuprofen.  Medicines that contain an artificial (synthetic) hormone called progestin.  Medicines that make the ovaries stop working for a short time.  Iron supplements to treat anemia.  If medicines do not work, surgery may be done. Surgical options may include:  Dilation and curettage (D&C). In this procedure, your health care provider opens (dilates) your cervix and then scrapes or suctions tissue from the endometrium to reduce menstrual bleeding.  Operative hysteroscopy. In this procedure, a small tube with a light on the end (hysteroscope) is used to view your uterus and help remove polyps that may be causing heavy periods.  Endometrial ablation. This  is when various techniques are used to permanently destroy your entire endometrium. After endometrial ablation, most women have little or no menstrual flow. This procedure reduces your ability to become pregnant.  Endometrial resection. In this procedure, an electrosurgical wire loop is used to remove the  endometrium. This procedure reduces your ability to become pregnant.  Hysterectomy. This is surgical removal of the uterus. This is a permanent procedure that stops menstrual periods. Pregnancy is not possible after a hysterectomy.  Follow these instructions at home: Medicines  Take over-the-counter and prescription medicines exactly as told by your health care provider. This includes iron pills.  Do not change or switch medicines without asking your health care provider.  Do not take aspirin or medicines that contain aspirin 1 week before or during your menstrual period. Aspirin may make bleeding worse. General instructions  If you need to change your sanitary pad or tampon more than once every 2 hours, limit your activity until the bleeding stops.  Iron pills can cause constipation. To prevent or treat constipation while you are taking prescription iron supplements, your health care provider may recommend that you: ? Drink enough fluid to keep your urine clear or pale yellow. ? Take over-the-counter or prescription medicines. ? Eat foods that are high in fiber, such as fresh fruits and vegetables, whole grains, and beans. ? Limit foods that are high in fat and processed sugars, such as fried and sweet foods.  Eat well-balanced meals, including foods that are high in iron. Foods that have a lot of iron include leafy green vegetables, meat, liver, eggs, and whole grain breads and cereals.  Do not try to lose weight until the abnormal bleeding has stopped and your blood iron level is back to normal. If you need to lose weight, work with your health care provider to lose weight safely.  Keep all follow-up visits as told by your health care provider. This is important. Contact a health care provider if:  You soak through a pad or tampon every 1 or 2 hours, and this happens every time you have a period.  You need to use pads and tampons at the same time because you are bleeding so  much.  You have nausea, vomiting, diarrhea, or other problems related to medicines you are taking. Get help right away if:  You soak through more than a pad or tampon in 1 hour.  You pass clots bigger than 1 inch (2.5 cm) wide.  You feel short of breath.  You feel like your heart is beating too fast.  You feel dizzy or faint.  You feel very weak or tired. Summary  Menorrhagia is a condition in which menstrual periods are heavy or last longer than normal.  Treatment will depend on the cause of the condition and may include medicines or procedures.  Take over-the-counter and prescription medicines exactly as told by your health care provider. This includes iron pills.  Get help right away if you have heavy bleeding that soaks through more than a pad or tampon in 1 hour, you are passing large clots, or you feel dizzy, faint or short of breath. This information is not intended to replace advice given to you by your health care provider. Make sure you discuss any questions you have with your health care provider. Document Released: 01/03/2005 Document Revised: 12/28/2015 Document Reviewed: 12/28/2015 Elsevier Interactive Patient Education  [employer].

## 2017-03-03 NOTE — Progress Notes (Signed)
   Subjective:  Patient ID: Ashley Singleton, female    DOB: [birth year]  Age: 47 y.o. MRN: [id number]  CC: Follow-up (headaches) and Establish Care   HPI Ashley Singleton presents for to establish care.  History of hospital admission 01/07/2017-12/224/2018 for symptomatic anemia.  She presented to the ED after syncopal episode.  No history of head injury.  She reported history of long-standing fatigue.  Workup was done and she was found to be anemic with hemoglobin of 7.7.  History of fibroids and menorrhagia with regular cycles.  FOBT in the ED was found to be positive.  She reported history of frequent NSAID use.  GI was consulted and it was recommended patient receive blood transfusion however patient declined.  She was agreeable to iron infusion instead.  Colonoscopy could not before performed inpatient due to poor prep.  It was recommended she follow-up with a GI and GYN outpatient. She followed up with GI who performed colonoscopy and it was recommended that she follow-up for repeat colonoscopy in 10 years. Patient declined gynecology appointment due to cost.She reports history of uterine fibroids 8 years ago.  Menstrual cycles typically last 4-5 days with heavy flow.  She is currently on menstrual cycle.  She reports using 6-8 overnight pads per day.  She is currently taking iron supplements for anemia.   Outpatient Medications Prior to Visit  Medication Sig Dispense Refill  . ferrous sulfate 325 (65 FE) MG tablet Take 1 tablet (325 mg total) by mouth daily with breakfast. 30 tablet 1   No facility-administered medications prior to visit.     ROS Review of Systems  Constitutional: Negative.   Respiratory: Negative.   Cardiovascular: Negative.   Gastrointestinal: Negative.   Genitourinary: Positive for menstrual problem.  Skin: Negative.   Neurological: Negative.   Psychiatric/Behavioral: Negative.    Objective:  BP 122/83   Pulse 83   Temp 98.5 F (36.9 C) (Oral)   Resp 16   Ht 5'  5.5" (1.664 m)   Wt 179 lb 12.8 oz (81.6 kg)   LMP 02/27/2017   SpO2 99%   BMI 29.47 kg/m   BP/Weight 03/03/2017 7/74/1287 08/23/7670  Systolic BP 094 709 628  Diastolic BP 83 78 79  Wt. (Lbs) 179.8 174.2 171  BMI 29.47 28.99 28.46    Physical Exam  Constitutional: She appears well-developed and well-nourished.  Cardiovascular: Normal rate, regular rhythm, normal heart sounds and intact distal pulses.  Pulmonary/Chest: Effort normal and breath sounds normal.  Abdominal: Soft. Bowel sounds are normal.  Skin: Skin is warm and dry.  Psychiatric: She has a normal mood and affect.  Nursing note and vitals reviewed.    Assessment & Plan:   1. Menorrhagia with regular cycle Referral placed. Encouraged patient to apply for orange card and discount programs. - Ambulatory referral to Gynecology - US Transvaginal Non-OB; Future - US Pelvis Complete; Future  2. History of iron deficiency anemia  - CBC - Iron, TIBC and Ferritin Panel  3. History of uterine leiomyoma  - Ambulatory referral to Gynecology - US Transvaginal Non-OB; Future - US Pelvis Complete; Future    Follow-up: Return in about 3 months (around 05/31/2017) for Anemia.   Alfonse Spruce FNP

## 2017-03-03 NOTE — Progress Notes (Signed)
Pt here to f/u with headaches Taking Fe supplement

## 2017-03-04 LAB — CBC
Hematocrit: 36.6 % (ref 34.0–46.6)
Hemoglobin: 11.5 g/dL (ref 11.1–15.9)
MCH: 25.3 pg — AB (ref 26.6–33.0)
MCHC: 31.4 g/dL — AB (ref 31.5–35.7)
MCV: 81 fL (ref 79–97)
PLATELETS: 319 10*3/uL (ref 150–379)
RBC: 4.54 x10E6/uL (ref 3.77–5.28)
RDW: 24.4 % — AB (ref 12.3–15.4)
WBC: 4.8 10*3/uL (ref 3.4–10.8)

## 2017-03-04 LAB — IRON,TIBC AND FERRITIN PANEL
Ferritin: 48 ng/mL (ref 15–150)
Iron Saturation: 15 % (ref 15–55)
Iron: 55 ug/dL (ref 27–159)
Total Iron Binding Capacity: 356 ug/dL (ref 250–450)
UIBC: 301 ug/dL (ref 131–425)

## 2017-03-09 ENCOUNTER — Ambulatory Visit (HOSPITAL_COMMUNITY): Payer: Self-pay

## 2017-03-09 ENCOUNTER — Ambulatory Visit (HOSPITAL_COMMUNITY)
Admission: RE | Admit: 2017-03-09 | Discharge: 2017-03-09 | Disposition: A | Discharge status: Home / Self Care | Payer: Self-pay | Source: Ambulatory Visit | Attending: Family Medicine | Admitting: Family Medicine

## 2017-03-09 ENCOUNTER — Telehealth: Payer: Self-pay

## 2017-03-09 DIAGNOSIS — D252 Subserosal leiomyoma of uterus: Secondary | ICD-10-CM | POA: Insufficient documentation

## 2017-03-09 DIAGNOSIS — Z86018 Personal history of other benign neoplasm: Secondary | ICD-10-CM | POA: Insufficient documentation

## 2017-03-09 DIAGNOSIS — N920 Excessive and frequent menstruation with regular cycle: Secondary | ICD-10-CM | POA: Insufficient documentation

## 2017-03-09 DIAGNOSIS — R9389 Abnormal findings on diagnostic imaging of other specified body structures: Secondary | ICD-10-CM | POA: Insufficient documentation

## 2017-03-09 DIAGNOSIS — D250 Submucous leiomyoma of uterus: Secondary | ICD-10-CM | POA: Insufficient documentation

## 2017-03-09 NOTE — Telephone Encounter (Signed)
Patient was called and mailbox is full.   If patient returns phone call please inform patient Labs show anemia has improved.  Iron levels are normal.  Continue to take iron supplement.  Good sources of iron include dark green leafy vegetables, meats, beans, and iron fortified cereals.  Follow up with gynecology referral.

## 2017-03-14 ENCOUNTER — Telehealth: Payer: Self-pay | Admitting: Family Medicine

## 2017-03-14 NOTE — Telephone Encounter (Signed)
Pt called and received lab results,She was read what her nurse left on file. she had no further questions and understood results.

## 2017-03-22 ENCOUNTER — Other Ambulatory Visit: Payer: Self-pay

## 2017-03-22 ENCOUNTER — Ambulatory Visit (INDEPENDENT_AMBULATORY_CARE_PROVIDER_SITE_OTHER): Payer: Self-pay | Admitting: Obstetrics and Gynecology

## 2017-03-22 ENCOUNTER — Encounter: Payer: Self-pay | Admitting: Obstetrics and Gynecology

## 2017-03-22 ENCOUNTER — Encounter (HOSPITAL_COMMUNITY): Payer: Self-pay | Admitting: Nurse Practitioner

## 2017-03-22 ENCOUNTER — Emergency Department (HOSPITAL_COMMUNITY)
Admission: EM | Admit: 2017-03-22 | Discharge: 2017-03-22 | Disposition: A | Discharge status: Home / Self Care | Payer: Self-pay | Attending: Emergency Medicine | Admitting: Emergency Medicine

## 2017-03-22 VITALS — BP 141/80 | HR 68 | Wt 180.6 lb

## 2017-03-22 DIAGNOSIS — F32A Depression, unspecified: Secondary | ICD-10-CM

## 2017-03-22 DIAGNOSIS — N920 Excessive and frequent menstruation with regular cycle: Secondary | ICD-10-CM

## 2017-03-22 DIAGNOSIS — Z113 Encounter for screening for infections with a predominantly sexual mode of transmission: Secondary | ICD-10-CM

## 2017-03-22 DIAGNOSIS — Z0001 Encounter for general adult medical examination with abnormal findings: Secondary | ICD-10-CM

## 2017-03-22 DIAGNOSIS — N939 Abnormal uterine and vaginal bleeding, unspecified: Secondary | ICD-10-CM

## 2017-03-22 DIAGNOSIS — Z0000 Encounter for general adult medical examination without abnormal findings: Secondary | ICD-10-CM

## 2017-03-22 DIAGNOSIS — F329 Major depressive disorder, single episode, unspecified: Secondary | ICD-10-CM | POA: Insufficient documentation

## 2017-03-22 DIAGNOSIS — R45851 Suicidal ideations: Secondary | ICD-10-CM | POA: Insufficient documentation

## 2017-03-22 DIAGNOSIS — N898 Other specified noninflammatory disorders of vagina: Secondary | ICD-10-CM

## 2017-03-22 MED ORDER — MEDROXYPROGESTERONE ACETATE 10 MG PO TABS: 10.0000 mg | ORAL_TABLET | Freq: Every day | ORAL | 2 refills | Status: DC

## 2017-03-22 NOTE — Progress Notes (Signed)
GYNECOLOGY ANNUAL PREVENTATIVE CARE ENCOUNTER NOTE  Subjective:   Ashley Singleton is a 47 y.o. G31P1101 female here for a annual gynecologic exam. Current complaints: heavy menstrual bleeding. Had episode in 12/2016 where she wasn't feeling good, "fell over" and then was admitted to hospital for symptomatic anemia for several days. Had IV iron transfusion, did not require blood transfusion at that time. Had positive heme in stool, had colonoscopy with poor prep and repeat as outpatient that she reports was normal.  Has regular monthly cycles, about 30 days, bleeds 5-6 days, goes through a big pad every few hours. Has always had heavy periods. Was previously on OCPs. Has not been on contraception in ~20 years. Thinks she had a pap smear during last pregnancy which was about 9 years ago, has never had abnormal pap that she is aware of.   Gynecologic History Patient's last menstrual period was 03/08/2017 (within days). Contraception: none Last Pap: normal per patient, 8 years ago Last mammogram: has never had  Obstetric History OB History  Gravida Para Term Preterm AB Living  2 2 1 1  0 1  SAB TAB Ectopic Multiple Live Births  0       2    # Outcome Date GA Lbr Len/2nd Weight Sex Delivery Anes PTL Lv  2 Term     F Vag-Spont   LIV  1 Preterm     M Vag-Spont   ND      Past Medical History:  Diagnosis Date  . Allergy   . Anemia   . Fibroids     Past Surgical History:  Procedure Laterality Date  . COLONOSCOPY    . ESOPHAGOGASTRODUODENOSCOPY N/A 01/09/2017   Procedure: ESOPHAGOGASTRODUODENOSCOPY (EGD);  Surgeon: Doran Stabler, MD;  Location: [facility] ENDOSCOPY;  Service: Endoscopy;  Laterality: N/A;  . TONSILLECTOMY      Current Outpatient Medications on File Prior to Visit  Medication Sig Dispense Refill  . ferrous sulfate 325 (65 FE) MG tablet Take 1 tablet (325 mg total) by mouth daily with breakfast. 30 tablet 1   No current facility-administered medications on file prior  to visit.     Allergies  Allergen Reactions  . Shrimp [Shellfish Allergy] Itching    Social History   Socioeconomic History  . Marital status: Single    Spouse name: Not on file  . Number of children: 1  . Years of education: Not on file  . Highest education level: Not on file  Social Needs  . Financial resource strain: Not on file  . Food insecurity - worry: Not on file  . Food insecurity - inability: Not on file  . Transportation needs - medical: Not on file  . Transportation needs - non-medical: Not on file  Occupational History    Employer: [employer]  Tobacco Use  . Smoking status: Never Smoker  . Smokeless tobacco: Never Used  Substance and Sexual Activity  . Alcohol use: No  . Drug use: No  . Sexual activity: No    Birth control/protection: None  Other Topics Concern  . Not on file  Social History Narrative  . Not on file   Family History  Problem Relation Age of Onset  . Diabetes Mother   . Multiple myeloma Maternal Grandmother   . Prostate cancer Maternal Grandfather   . Pancreatic cancer Paternal Grandfather   . Colon cancer Neg Hx   . Esophageal cancer Neg Hx   . Rectal cancer Neg Hx   .  Stomach cancer Neg Hx    Diet: reports it is normal, tries to avoid bread, eats veggies Exercise: does not exercise  The following portions of the patient's history were reviewed and updated as appropriate: allergies, current medications, past family history, past medical history, past social history, past surgical history and problem list.  Review of Systems Pertinent items are noted in HPI.   Objective:  BP (!) 141/80 (BP Location: Left Arm, Patient Position: Sitting, Cuff Size: Large)   Pulse 68   Wt 180 lb 9.6 oz (81.9 kg)   LMP 03/08/2017 (Within Days)   BMI 29.60 kg/m  CONSTITUTIONAL: Well-developed, well-nourished female in no acute distress.  HENT:  Normocephalic, atraumatic, External right and left ear normal. Oropharynx is clear and  moist EYES: Conjunctivae and EOM are normal. Pupils are equal, round, and reactive to light. No scleral icterus.  NECK: Normal range of motion, supple, no masses.  Normal thyroid.  SKIN: Skin is warm and dry. No rash noted. Not diaphoretic. No erythema. No pallor. NEUROLOGIC: Alert and oriented to person, place, and time. Normal reflexes, muscle tone coordination. No cranial nerve deficit noted. PSYCHIATRIC: Normal mood and affect. Normal behavior. Normal judgment and thought content. CARDIOVASCULAR: Normal heart rate noted, regular rhythm RESPIRATORY: Clear to auscultation bilaterally. Effort and breath sounds normal, no problems with respiration noted. BREASTS: Symmetric in size. No masses, skin changes, nipple drainage, or lymphadenopathy. ABDOMEN: Soft, normal bowel sounds, no distention noted.  No tenderness, rebound or guarding.  PELVIC: Normal appearing external genitalia; normal appearing vaginal mucosa and cervix.  No abnormal discharge noted. Normal uterine size, no other palpable masses, no uterine or adnexal tenderness. MUSCULOSKELETAL: Normal range of motion. No tenderness.  No cyanosis, clubbing, or edema.  2+ distal pulses.   Assessment and Plan:   1. Vaginal discharge - Cervicovaginal ancillary only  2. Menorrhagia with regular cycle With some small fibroids, regular cycle that has not changed Recommend start with hormonal management and if no improvement, plan for return for EMB and possible levonorgestrel IUD placement With mildly elevated BP in hospital and here, will not start combined hormonal management She is agreeable to this plan, will return for follow up 3 months Provera sent to pharmacy  3. Depression, unspecified depression type PQH9 significant for daily thoughts of self-harm Denies thoughts of harm to child Does not have plan and reports she would not follow through with plan Given screening, recommend presenting to behavioral health for psych eval, she is  agreeable, will drive herself to [facility] for psych eval  4. Routine screening for STI (sexually transmitted infection) Agreeable to GC/CT Left prior to getting Hep C, RPR, will plan to do at next visit  5. Annual physical exam Normal exam To f/u for mammogram, info given for mammogram scholarship Info given for free pap clinic   To [facility] now for psych eval  Will follow up results of STI screen and manage accordingly. Encouraged improvement in diet and exercise.   Free pap smear clinic info given Mammogram scheduled (mammogram scholarship) Colonoscopy UTD Flu vaccine UTD  Routine preventative health maintenance measures emphasized. Please refer to After Visit Summary for other counseling recommendations.    Feliz Beam, M.D. Attending [place], [facility] for [employer], [place]

## 2017-03-22 NOTE — ED Triage Notes (Signed)
Patient is depressed and has thoughts of harming herself but says she will never act on them. Patient doesn't want to stay she just wants to talk to someone

## 2017-03-22 NOTE — Discharge Instructions (Signed)
Outpatient Psychiatry and Counseling ° °[facility]: Mobile Crisis Management 24 hours:  [phone] ° °[facility] sliding scale fee and walk in schedule: M-F 8am-12pm/1pm-3pm °[address]  °[place], [place] [address] °[phone] ° °[facility] °[address] °[place], [place] [address] °[phone] ° °[facility] (Former[facility] known [facility])- new patient walk-in appointments available Monday - Friday 8am -3pm.          °[address] °[place], [place] [address] [phone] or crisis line- [phone] ° °[facility]/ Intensive Outpatient Therapy Program °[address] °[place], [place] [address] °[phone] ° °[facility]                  °Crisis Services      °[phone]      °[address]     °[place], [place] [address]                ° °[facility]   °[facility] [phone] °[address] °[place], [place] [address] ° ° °[facility]          °[address]  °[place], [place] [address]       °[phone] ° °[facility] °[address] °[place], [place] [address] °[phone] ° °[facility]    °[address]    °[place], [place] [address]     °[phone]      ° °Parish McKinney, MD     °[address]     °[place] [address]     °[phone]     °  °[facility] °[address] °[place] [address] ° °[facility]     °[address]     °[place],       °[phone]      ° °[facility] °Shamsher Ahluwalia, MD °[address] °[place],  [address] °[phone] ° °[facility]     °[address]     °[place],  [address]     °[phone]      ° °[facility] °[address] °[place],  [address] °[phone] °Resources for Temporary  Residential Assistance/Crisis Centers ° °

## 2017-03-22 NOTE — ED Provider Notes (Signed)
[facility] DEPT Provider Note   CSN: [id number] Arrival date & time: 03/22/17  1158     History   Chief Complaint Chief Complaint  Patient presents with  . Suicidal  . Depression    HPI Ashley Singleton is a 47 y.o. female.  HPI  47 year old female sent in by her doctor for evaluation of suicidal thoughts.  The patient states that she was going for a follow-up and on the questionnaire that given to her she answered yes to having had suicidal thoughts.  Patient states she is had depression-like symptoms and suicidal thoughts on and off for months to years.  When asked if anything is different today she says no.  She does not feel actively suicidal and states that while she does have thoughts pop into her head she would never act on them due to taking care of her mother and daughter.  She states that she has a lot of stress in her life including 2 jobs as well as having to full-time take care of her mom.  She has trouble with her daughter where they fight a lot but she has never struck her or acted aggressive towards her daughter.  She does not have any guns in the house.  Patient otherwise feels well and has been taking iron for symptomatic anemia found a few months ago.  Past Medical History:  Diagnosis Date  . Allergy   . Anemia   . Fibroids     Patient Active Problem List   Diagnosis Date Noted  . Iron deficiency anemia due to chronic blood loss   . Symptomatic anemia 01/07/2017    Past Surgical History:  Procedure Laterality Date  . COLONOSCOPY    . ESOPHAGOGASTRODUODENOSCOPY N/A 01/09/2017   Procedure: ESOPHAGOGASTRODUODENOSCOPY (EGD);  Surgeon: Doran Stabler, MD;  Location: [facility]COPY;  Service: Endoscopy;  Laterality: N/A;  . TONSILLECTOMY      OB History    Gravida Para Term Preterm AB Living   _0 0 1   SAB TAB Ectopic Multiple Live Births   0       2       Home Medications    Prior to Admission medications     Medication Sig Start Date End Date Taking? Authorizing Provider  ferrous sulfate 325 (65 FE) MG tablet Take 1 tablet (325 mg total) by mouth daily with breakfast. 02/01/17  Yes Swords, Darrick Penna, MD  medroxyPROGESTERone (PROVERA) 10 MG tablet Take 1 tablet (10 mg total) by mouth daily. 03/22/17   Sloan Leiter, MD    Family History Family History  Problem Relation Age of Onset  . Diabetes Mother   . Multiple myeloma Maternal Grandmother   . Prostate cancer Maternal Grandfather   . Pancreatic cancer Paternal Grandfather   . Colon cancer Neg Hx   . Esophageal cancer Neg Hx   . Rectal cancer Neg Hx   . Stomach cancer Neg Hx     Social History Social History   Tobacco Use  . Smoking status: Never Smoker  . Smokeless tobacco: Never Used  Substance Use Topics  . Alcohol use: No  . Drug use: No     Allergies   Shrimp [shellfish allergy]   Review of Systems Review of Systems  Constitutional: Negative for fatigue and fever.  Respiratory: Negative for shortness of breath.   Cardiovascular: Negative for chest pain.  Neurological: Negative for headaches.  Psychiatric/Behavioral: Positive for dysphoric mood and  suicidal ideas.  All other systems reviewed and are negative.    Physical Exam Updated Vital Signs BP (!) 144/89 (BP Location: Right Arm)   Pulse 89   Temp 98.5 F (36.9 C) (Oral)   Resp 18   Ht 5' 5.5" (1.664 m)   Wt 81.6 kg (180 lb)   LMP 03/08/2017 (Within Days)   SpO2 99%   BMI 29.50 kg/m   Physical Exam  Constitutional: She is oriented to person, place, and time. She appears well-developed and well-nourished. No distress.  HENT:  Head: Normocephalic and atraumatic.  Right Ear: External ear normal.  Left Ear: External ear normal.  Nose: Nose normal.  Eyes: Right eye exhibits no discharge. Left eye exhibits no discharge.  Cardiovascular: Normal rate, regular rhythm and normal heart sounds.  Pulmonary/Chest: Effort normal and breath sounds normal.   Abdominal: Soft. She exhibits no distension. There is no tenderness.  Neurological: She is alert and oriented to person, place, and time.  Skin: Skin is warm and dry. She is not diaphoretic.  Psychiatric: Her speech is not delayed and not slurred. She expresses no homicidal and no suicidal ideation.  Nursing note and vitals reviewed.    ED Treatments / Results  Labs (all labs ordered are listed, but only abnormal results are displayed) Labs Reviewed - No data to display  EKG  EKG Interpretation None       Radiology No results found.  Procedures Procedures (including critical care time)  Medications Ordered in ED Medications - No data to display   Initial Impression / Assessment and Plan / ED Course  I have reviewed the triage vital signs and the nursing notes.  Pertinent labs & imaging results that were available during my care of the patient were reviewed by me and considered in my medical decision making (see chart for details).     The patient is overall well-appearing.  Moderate hypertension but her vitals are otherwise benign.  While she does describe depressed behavior and mood along with passive suicidal thoughts, I have low concern that she will kill herself.  I doubt she will harm herself.  She appears stable for an outpatient workup with psychiatry and has been given resources.  I do not think she needs an emergent psychiatric consult or admission.  She contracts for safety.  Otherwise she has no other medical complaints and appears stable for discharge home.  Final Clinical Impressions(s) / ED Diagnoses   Final diagnoses:  Depression, unspecified depression type    ED Discharge Orders    None       Sherwood Gambler, MD 03/22/17 1622

## 2017-03-22 NOTE — Progress Notes (Signed)
Patient scored 13 and 12 on PHQ9 and GAD7 respectively. Answered "3" in thoughts of self harm. Dr Rosana Hoes is aware. [facility] is not in office today.

## 2017-03-23 ENCOUNTER — Other Ambulatory Visit: Payer: Self-pay | Admitting: Obstetrics and Gynecology

## 2017-03-23 LAB — CERVICOVAGINAL ANCILLARY ONLY
Bacterial vaginitis: POSITIVE — AB
CHLAMYDIA, DNA PROBE: NEGATIVE
Candida vaginitis: NEGATIVE
Neisseria Gonorrhea: NEGATIVE
Trichomonas: NEGATIVE

## 2017-03-23 MED ORDER — METRONIDAZOLE 500 MG PO TABS: 500.0000 mg | ORAL_TABLET | Freq: Two times a day (BID) | ORAL | 0 refills | Status: DC

## 2017-03-23 NOTE — Progress Notes (Signed)
Flagyl sent to pharmacy for BV

## 2017-03-31 ENCOUNTER — Other Ambulatory Visit: Payer: Self-pay | Admitting: Nurse Practitioner

## 2017-03-31 ENCOUNTER — Other Ambulatory Visit: Payer: Self-pay | Admitting: Family Medicine

## 2017-03-31 DIAGNOSIS — Z1231 Encounter for screening mammogram for malignant neoplasm of breast: Secondary | ICD-10-CM

## 2017-04-03 ENCOUNTER — Telehealth: Payer: Self-pay | Admitting: *Deleted

## 2017-04-03 MED ORDER — MEDROXYPROGESTERONE ACETATE 10 MG PO TABS: 10.0000 mg | ORAL_TABLET | Freq: Every day | ORAL | 2 refills | Status: DC

## 2017-04-03 NOTE — Telephone Encounter (Signed)
Called [facility] pharmacy and confirmed that pt received Metronidazole and was told that pt picked up medication on 3/7. I then called pt to follow up with her. I asked if she is still having vaginal discharge after taking Metronidazole. I stated that her test showed +BV. Pt stated that she has not taken the medication yet and stated "But I will". Pt also stated confusion regarding the medication because she thought it was going to be one tablet daily and need to be taken for awhile. Per chart review, pt was prescribed Provera on Shealyn Sean of office visit however the prescription was sent to another pharmacy which she was not aware. I clarified all medications and correct dosages. Rx for Provera was sent to her preferred pharmacy - [facility].  Pt voiced understanding.

## 2017-05-01 ENCOUNTER — Other Ambulatory Visit: Payer: Self-pay

## 2017-05-03 LAB — CYTOLOGY - PAP: Diagnosis: NEGATIVE

## 2017-05-31 ENCOUNTER — Ambulatory Visit: Payer: Self-pay | Admitting: Nurse Practitioner

## 2017-06-21 ENCOUNTER — Encounter: Payer: Self-pay | Admitting: Nurse Practitioner

## 2017-06-21 ENCOUNTER — Ambulatory Visit: Payer: Self-pay | Attending: Nurse Practitioner | Admitting: Nurse Practitioner

## 2017-06-21 VITALS — BP 132/84 | HR 80 | Temp 98.7°F | Ht 65.5 in | Wt 186.4 lb

## 2017-06-21 DIAGNOSIS — D500 Iron deficiency anemia secondary to blood loss (chronic): Secondary | ICD-10-CM

## 2017-06-21 DIAGNOSIS — D259 Leiomyoma of uterus, unspecified: Secondary | ICD-10-CM | POA: Insufficient documentation

## 2017-06-21 NOTE — Progress Notes (Signed)
Assessment & Plan:  Ashley Singleton was seen today for establish care.  Diagnoses and all orders for this visit:  Iron deficiency anemia due to chronic blood loss Continue OTC ferrous sulfate 367m daily. Follow up with GYN as instructed   Patient has been counseled on age-appropriate routine health concerns for screening and prevention. These are reviewed and up-to-date. Referrals have been placed accordingly. Immunizations are up-to-date or declined.    Subjective:   Chief Complaint  Patient presents with  . Establish Care    Pt. would like to check for iron levels.    HPI Ashley LINCH89y.o. female presents to office today to establish care.    Anemia She has a history of iron deficiency anemia. Symptoms include fatigue and malaise. Recent colonoscopy for heme positive stool was normal.  She is currently taking OTC iron tablets. Anemia is currently stable. Recent iron studies were normal. She has a history of menorrhagia as well as uterine fibroids.  PER GYN note on 03-22-2017: 4 With some small fibroids, regular cycle that has not changed Recommend start with hormonal management and if no improvement, plan for return for EMB and possible levonorgestrel IUD placement With mildly elevated BP in hospital and here, will not start combined hormonal management She is agreeable to this plan, will return for follow up 3 months Provera sent to pharmacy As of today she has not followed up with GYN  Lab Results  Component Value Date   WBC 4.8 03/03/2017   HGB 11.5 03/03/2017   HCT 36.6 03/03/2017   MCV 81 03/03/2017   PLT 319 03/03/2017   Lab Results  Component Value Date   IRON 55 03/03/2017   TIBC 356 03/03/2017   FERRITIN 48 03/03/2017    Review of Systems  Constitutional: Positive for malaise/fatigue. Negative for fever and weight loss.  HENT: Negative.  Negative for nosebleeds.   Eyes: Negative.  Negative for blurred vision, double vision and photophobia.  Respiratory:  Negative.  Negative for cough and shortness of breath.   Cardiovascular: Negative.  Negative for chest pain, palpitations and leg swelling.  Gastrointestinal: Negative.  Negative for heartburn, nausea and vomiting.  Genitourinary: Negative for dysuria, flank pain, frequency, hematuria and urgency.       Menorrhagia  Musculoskeletal: Negative.  Negative for myalgias.  Neurological: Negative.  Negative for dizziness, focal weakness, seizures and headaches.  Endo/Heme/Allergies: Positive for environmental allergies.  Psychiatric/Behavioral: Negative.  Negative for suicidal ideas.    Past Medical History:  Diagnosis Date  . Allergy   . Anemia   . Fibroids     Past Surgical History:  Procedure Laterality Date  . COLONOSCOPY     two   . ESOPHAGOGASTRODUODENOSCOPY N/A 01/09/2017   Procedure: ESOPHAGOGASTRODUODENOSCOPY (EGD);  Surgeon: DDoran Stabler MD;  [facility];  Service: Endoscopy;  Laterality: N/A;  . TONSILLECTOMY      Family History  Problem Relation Age of Onset  . Diabetes Mother   . Multiple myeloma Maternal Grandmother   . Prostate cancer Maternal Grandfather   . Pancreatic cancer Paternal Grandfather   . Colon cancer Neg Hx   . Esophageal cancer Neg Hx   . Rectal cancer Neg Hx   . Stomach cancer Neg Hx     Social History Reviewed with no changes to be made today.   Outpatient Medications Prior to Visit  Medication Sig Dispense Refill  . ferrous sulfate 325 (65 FE) MG tablet Take 1 tablet (325 mg total)  by mouth daily with breakfast. 30 tablet 1  . metroNIDAZOLE (FLAGYL) 500 MG tablet Take 1 tablet (500 mg total) by mouth 2 (two) times daily. 14 tablet 0  . medroxyPROGESTERone (PROVERA) 10 MG tablet Take 1 tablet (10 mg total) by mouth daily. (Patient not taking: Reported on 06/21/2017) 30 tablet 2   No facility-administered medications prior to visit.     Allergies  Allergen Reactions  . Shrimp [Shellfish Allergy] Itching       Objective:      BP 132/84 (BP Location: Right Arm, Patient Position: Sitting, Cuff Size: Normal)   Pulse 80   Temp 98.7 F (37.1 C) (Oral)   Ht 5' 5.5" (1.664 m)   Wt 186 lb 6.4 oz (84.6 kg)   SpO2 96%   BMI 30.55 kg/m  Wt Readings from Last 3 Encounters:  06/21/17 186 lb 6.4 oz (84.6 kg)  03/22/17 180 lb (81.6 kg)  03/22/17 180 lb 9.6 oz (81.9 kg)    Physical Exam  Constitutional: She is oriented to person, place, and time. She appears well-developed and well-nourished. She is cooperative.  HENT:  Head: Normocephalic and atraumatic.  Eyes: EOM are normal.  Neck: Normal range of motion.  Cardiovascular: Normal rate, regular rhythm, normal heart sounds and intact distal pulses. Exam reveals no gallop and no friction rub.  No murmur heard. Pulmonary/Chest: Effort normal and breath sounds normal. No tachypnea. No respiratory distress. She has no decreased breath sounds. She has no wheezes. She has no rhonchi. She has no rales. She exhibits no tenderness.  Abdominal: Bowel sounds are normal.  Musculoskeletal: Normal range of motion. She exhibits no edema.  Neurological: She is alert and oriented to person, place, and time. Coordination normal.  Skin: Skin is warm and dry.  Psychiatric: She has a normal mood and affect. Her behavior is normal. Judgment and thought content normal.  Nursing note and vitals reviewed.      Patient has been counseled extensively about nutrition and exercise as well as the importance of adherence with medications and regular follow-up. The patient was given clear instructions to go to ER or return to medical center if symptoms don't improve, worsen or new problems develop. The patient verbalized understanding.   Follow-up: Return in about 2 months (around 08/21/2017) for FASTING labs and Physical.   [name], FNP-BC [facility] and [facility] [place], [place]   06/24/2017, 12:03 AM

## 2017-06-21 NOTE — Patient Instructions (Signed)
Uterine Fibroids Uterine fibroids are tissue masses (tumors) that can develop in the womb (uterus). They are also called leiomyomas. This type of tumor is not cancerous (benign) and does not spread to other parts of the body outside of the pelvic area, which is between the hip bones. Occasionally, fibroids may develop in the fallopian tubes, in the cervix, or on the support structures (ligaments) that surround the uterus. You can have one or many fibroids. Fibroids can vary in size, weight, and where they grow in the uterus. Some can become quite large. Most fibroids do not require medical treatment. What are the causes? A fibroid can develop when a single uterine cell keeps growing (replicating). Most cells in the human body have a control mechanism that keeps them from replicating without control. What are the signs or symptoms? Symptoms may include:  Heavy bleeding during your period.  Bleeding or spotting between periods.  Pelvic pain and pressure.  Bladder problems, such as needing to urinate more often (urinary frequency) or urgently.  Inability to reproduce offspring (infertility).  Miscarriages.  How is this diagnosed? Uterine fibroids are diagnosed through a physical exam. Your health care provider may feel the lumpy tumors during a pelvic exam. Ultrasonography and an MRI may be done to determine the size, location, and number of fibroids. How is this treated? Treatment may include:  Watchful waiting. This involves getting the fibroid checked by your health care provider to see if it grows or shrinks. Follow your health care provider's recommendations for how often to have this checked.  Hormone medicines. These can be taken by mouth or given through an intrauterine device (IUD).  Surgery. ? Removing the fibroids (myomectomy) or the uterus (hysterectomy). ? Removing blood supply to the fibroids (uterine artery embolization).  If fibroids interfere with your fertility and you  want to become pregnant, your health care provider may recommend having the fibroids removed. Follow these instructions at home:  Keep all follow-up visits as directed by your health care provider. This is important.  Take over-the-counter and prescription medicines only as told by your health care provider. ? If you were prescribed a hormone treatment, take the hormone medicines exactly as directed.  Ask your health care provider about taking iron pills and increasing the amount of dark green, leafy vegetables in your diet. These actions can help to boost your blood iron levels, which may be affected by heavy menstrual bleeding.  Pay close attention to your period and tell your health care provider about any changes, such as: ? Increased blood flow that requires you to use more pads or tampons than usual per month. ? A change in the number of days that your period lasts per month. ? A change in symptoms that are associated with your period, such as abdominal cramping or back pain. Contact a health care provider if:  You have pelvic pain, back pain, or abdominal cramps that cannot be controlled with medicines.  You have an increase in bleeding between and during periods.  You soak tampons or pads in a half hour or less.  You feel lightheaded, extra tired, or weak. Get help right away if:  You faint.  You have a sudden increase in pelvic pain. This information is not intended to replace advice given to you by your health care provider. Make sure you discuss any questions you have with your health care provider. Document Released: 01/01/2000 Document Revised: 09/03/2015 Document Reviewed: 07/02/2013 Elsevier Interactive Patient Education  2018 Elsevier Inc.    Dysfunctional Uterine Bleeding Dysfunctional uterine bleeding is abnormal bleeding from the uterus. Dysfunctional uterine bleeding includes:  A period that comes earlier or later than usual.  A period that is lighter, heavier,  or has blood clots.  Bleeding between periods.  Skipping one or more periods.  Bleeding after sexual intercourse.  Bleeding after menopause.  Follow these instructions at home: Pay attention to any changes in your symptoms. Follow these instructions to help with your condition: Eating and drinking  Eat well-balanced meals. Include foods that are high in iron, such as liver, meat, shellfish, green leafy vegetables, and eggs.  If you become constipated: ? Drink plenty of water. ? Eat fruits and vegetables that are high in water and fiber, such as spinach, carrots, raspberries, apples, and mango. Medicines  Take over-the-counter and prescription medicines only as told by your health care provider.  Do not change medicines without talking with your health care provider.  Aspirin or medicines that contain aspirin may make the bleeding worse. Do not take those medicines: ? During the week before your period. ? During your period.  If you were prescribed iron pills, take them as told by your health care provider. Iron pills help to replace iron that your body loses because of this condition. Activity  If you need to change your sanitary pad or tampon more than one time every 2 hours: ? Lie in bed with your feet raised (elevated). ? Place a cold pack on your lower abdomen. ? Rest as much as possible until the bleeding stops or slows down.  Do not try to lose weight until the bleeding has stopped and your blood iron level is back to normal. Other Instructions  For two months, write down: ? When your period starts. ? When your period ends. ? When any abnormal bleeding occurs. ? What problems you notice.  Keep all follow up visits as told by your health care provider. This is important. Contact a health care provider if:  You get light-headed or weak.  You have nausea and vomiting.  You cannot eat or drink without vomiting.  You feel dizzy or have diarrhea while you are  taking medicines.  You are taking birth control pills or hormones, and you want to change them or stop taking them. Get help right away if:  You develop a fever or chills.  You need to change your sanitary pad or tampon more than one time per hour.  Your bleeding becomes heavier, or your flow contains clots more often.  You develop pain in your abdomen.  You lose consciousness.  You develop a rash. This information is not intended to replace advice given to you by your health care provider. Make sure you discuss any questions you have with your health care provider. Document Released: 01/01/2000 Document Revised: 06/11/2015 Document Reviewed: 03/31/2014 Elsevier Interactive Patient Education  [employer].

## 2017-06-24 ENCOUNTER — Encounter: Payer: Self-pay | Admitting: Nurse Practitioner

## 2017-08-18 ENCOUNTER — Other Ambulatory Visit: Payer: Self-pay | Admitting: *Deleted

## 2017-08-18 NOTE — Telephone Encounter (Signed)
Received fax for refill of medroxyprogesterone 10mg . Will forward to provider.

## 2017-08-21 MED ORDER — MEDROXYPROGESTERONE ACETATE 10 MG PO TABS: 10.0000 mg | ORAL_TABLET | Freq: Every day | ORAL | 2 refills | Status: DC

## 2017-08-29 ENCOUNTER — Encounter: Payer: Self-pay | Admitting: Nurse Practitioner

## 2017-08-29 ENCOUNTER — Ambulatory Visit: Payer: Self-pay | Attending: Nurse Practitioner | Admitting: Nurse Practitioner

## 2017-08-29 VITALS — BP 136/83 | HR 65 | Temp 98.5°F | Ht 65.0 in | Wt 185.2 lb

## 2017-08-29 DIAGNOSIS — Z0000 Encounter for general adult medical examination without abnormal findings: Secondary | ICD-10-CM

## 2017-08-29 NOTE — Progress Notes (Signed)
Assessment & Plan:  Ashley Singleton was seen today for annual exam.  Diagnoses and all orders for this visit:  Well woman exam (no gynecological exam) -     CBC -     Basic metabolic panel -     Lipid panel    Patient has been counseled on age-appropriate routine health concerns for screening and prevention. These are reviewed and up-to-date. Referrals have been placed accordingly. Immunizations are up-to-date or declined.    Subjective:   Chief Complaint  Patient presents with  . Annual Exam    Pt. is here for a physical and fasting for labs.    HPI Ashley Singleton 48 y.o. female presents to office today for well woman exam.   Review of Systems  Constitutional: Negative.  Negative for chills, fever, malaise/fatigue and weight loss.  HENT: Negative.  Negative for congestion, hearing loss, sinus pain and sore throat.   Eyes: Negative.  Negative for blurred vision, double vision, photophobia and pain.  Respiratory: Negative.  Negative for cough, sputum production, shortness of breath and wheezing.   Cardiovascular: Negative.  Negative for chest pain and leg swelling.  Gastrointestinal: Negative.  Negative for abdominal pain, constipation, diarrhea, heartburn, nausea and vomiting.  Genitourinary: Negative.   Musculoskeletal: Positive for joint pain (bilateral knees). Negative for back pain and myalgias.  Skin: Negative.  Negative for rash.  Neurological: Negative.  Negative for dizziness, tremors, speech change, focal weakness, seizures and headaches.  Endo/Heme/Allergies: Negative.  Negative for environmental allergies.  Psychiatric/Behavioral: Negative.  Negative for depression and suicidal ideas. The patient is not nervous/anxious and does not have insomnia.     Past Medical History:  Diagnosis Date  . Allergy   . Anemia   . Fibroids     Past Surgical History:  Procedure Laterality Date  . COLONOSCOPY     two   . ESOPHAGOGASTRODUODENOSCOPY N/A 01/09/2017   Procedure:  ESOPHAGOGASTRODUODENOSCOPY (EGD);  Surgeon: Doran Stabler, MD;  Location: [facility] ENDOSCOPY;  Service: Endoscopy;  Laterality: N/A;  . TONSILLECTOMY      Family History  Problem Relation Age of Onset  . Diabetes Mother   . Multiple myeloma Maternal Grandmother   . Prostate cancer Maternal Grandfather   . Pancreatic cancer Paternal Grandfather   . Colon cancer Neg Hx   . Esophageal cancer Neg Hx   . Rectal cancer Neg Hx   . Stomach cancer Neg Hx     Social History Reviewed with no changes to be made today.   Outpatient Medications Prior to Visit  Medication Sig Dispense Refill  . ferrous sulfate 325 (65 FE) MG tablet Take 1 tablet (325 mg total) by mouth daily with breakfast. 30 tablet 1  . medroxyPROGESTERone (PROVERA) 10 MG tablet Take 1 tablet (10 mg total) by mouth daily. (Patient not taking: Reported on 08/29/2017) 30 tablet 2   No facility-administered medications prior to visit.     Allergies  Allergen Reactions  . Shrimp [Shellfish Allergy] Itching       Objective:    Ht _0  (1.651 m)   Wt 185 lb 3.2 oz (84 kg)   BMI 30.82 kg/m  Wt Readings from Last 3 Encounters:  08/29/17 185 lb 3.2 oz (84 kg)  06/21/17 186 lb 6.4 oz (84.6 kg)  03/22/17 180 lb (81.6 kg)    Physical Exam  Constitutional: She is oriented to person, place, and time. She appears well-developed and well-nourished.  HENT:  Head: Normocephalic and atraumatic.  Right  Ear: External ear normal.  Left Ear: External ear normal.  Nose: Nose normal.  Mouth/Throat: Oropharynx is clear and moist. Abnormal dentition. No oropharyngeal exudate.  Eyes: Pupils are equal, round, and reactive to light. Conjunctivae and EOM are normal. Right eye exhibits no discharge. Left eye exhibits no discharge. No scleral icterus.  Neck: Normal range of motion. Neck supple. No tracheal deviation present. No thyromegaly present.  Cardiovascular: Normal rate, regular rhythm, normal heart sounds and intact distal pulses.  Exam reveals no friction rub.  No murmur heard. Pulses:      Dorsalis pedis pulses are 2+ on the right side, and 2+ on the left side.       Posterior tibial pulses are 2+ on the right side, and 2+ on the left side.  Pulmonary/Chest: Effort normal and breath sounds normal. No accessory muscle usage. No respiratory distress. She has no decreased breath sounds. She has no wheezes. She has no rhonchi. She has no rales. She exhibits no tenderness. Right breast exhibits no inverted nipple, no mass, no nipple discharge, no skin change and no tenderness. Left breast exhibits no inverted nipple, no mass, no nipple discharge, no skin change and no tenderness. Breasts are symmetrical.  Abdominal: Soft. Bowel sounds are normal. She exhibits no distension and no mass. There is no tenderness. There is no rebound and no guarding.  Musculoskeletal: Normal range of motion. She exhibits no edema, tenderness or deformity.  Feet:  Right Foot:  Skin Integrity: Negative for callus or dry skin.  Left Foot:  Skin Integrity: Negative for callus or dry skin.  Lymphadenopathy:    She has no cervical adenopathy.  Neurological: She is alert and oriented to person, place, and time. She has normal strength. No cranial nerve deficit or sensory deficit. She displays a negative Romberg sign. Coordination and gait normal.  Reflex Scores:      Patellar reflexes are 1+ on the right side and 1+ on the left side. Skin: Skin is warm and dry. Capillary refill takes less than 2 seconds. No erythema.  Psychiatric: She has a normal mood and affect. Her speech is normal and behavior is normal. Judgment and thought content normal.         Patient has been counseled extensively about nutrition and exercise as well as the importance of adherence with medications and regular follow-up. The patient was given clear instructions to go to ER or return to medical center if symptoms don't improve, worsen or new problems develop. The patient  verbalized understanding.   Follow-up: No follow-ups on file.   [name], FNP-BC [facility] and [facility], [place]   08/29/2017, 8:39 AM

## 2017-08-29 NOTE — Patient Instructions (Signed)

## 2017-08-30 LAB — LIPID PANEL
CHOL/HDL RATIO: 2.7 ratio (ref 0.0–4.4)
Cholesterol, Total: 130 mg/dL (ref 100–199)
HDL: 48 mg/dL (ref >39)
LDL CALC: 74 mg/dL (ref 0–99)
Triglycerides: 41 mg/dL (ref 0–149)
VLDL CHOLESTEROL CAL: 8 mg/dL (ref 5–40)

## 2017-08-30 LAB — CBC
HEMOGLOBIN: 11.9 g/dL (ref 11.1–15.9)
Hematocrit: 35.8 % (ref 34.0–46.6)
MCH: 28.7 pg (ref 26.6–33.0)
MCHC: 33.2 g/dL (ref 31.5–35.7)
MCV: 87 fL (ref 79–97)
Platelets: 337 10*3/uL (ref 150–450)
RBC: 4.14 x10E6/uL (ref 3.77–5.28)
RDW: 13.2 % (ref 12.3–15.4)
WBC: 4 10*3/uL (ref 3.4–10.8)

## 2017-08-30 LAB — BASIC METABOLIC PANEL
BUN / CREAT RATIO: 10 (ref 9–23)
BUN: 7 mg/dL (ref 6–24)
CO2: 23 mmol/L (ref 20–29)
CREATININE: 0.68 mg/dL (ref 0.57–1.00)
Calcium: 9 mg/dL (ref 8.7–10.2)
Chloride: 103 mmol/L (ref 96–106)
GFR, EST AFRICAN AMERICAN: 121 mL/min/{1.73_m2} (ref >59)
GFR, EST NON AFRICAN AMERICAN: 105 mL/min/{1.73_m2} (ref >59)
Glucose: 90 mg/dL (ref 65–99)
Potassium: 4.4 mmol/L (ref 3.5–5.2)
SODIUM: 136 mmol/L (ref 134–144)

## 2017-09-11 ENCOUNTER — Telehealth: Payer: Self-pay

## 2017-09-11 NOTE — Telephone Encounter (Signed)
-----   Message from [name], NP sent at 09/07/2017 11:20 PM EDT ----- All of your labs are perfectly normal including cholesterol, sodium, and kidney function.

## 2017-09-11 NOTE — Telephone Encounter (Signed)
CMA attempt to call patient to inform on lab results.  No answer and left a VM for patient to call back. If patient call back, please inform:  All of your labs are perfectly normal including cholesterol, sodium, and kidney function.

## 2017-11-23 ENCOUNTER — Telehealth: Payer: Self-pay | Admitting: *Deleted

## 2017-11-23 DIAGNOSIS — N920 Excessive and frequent menstruation with regular cycle: Secondary | ICD-10-CM

## 2017-11-23 MED ORDER — MEDROXYPROGESTERONE ACETATE 10 MG PO TABS: 10.0000 mg | ORAL_TABLET | Freq: Every day | ORAL | 0 refills | Status: AC

## 2017-11-23 NOTE — Telephone Encounter (Signed)
I called Ashley Singleton and explained we got a refill request for her provera from the pharmacy and that Dr.Davis has approved one refill but she wants her to make an appointment to be seen in the office before she will approve any other refills. She states her bleeding is fine and that she understands.

## 2017-11-23 NOTE — Telephone Encounter (Signed)
Received fax from [facility] requesting a refill on pt's Medroxyprogesterone 10mg .  Request sent to Dr. Rosana Hoes.

## 2017-11-23 NOTE — Telephone Encounter (Signed)
-----   Message from Sloan Leiter, MD sent at 11/23/2017  3:13 PM EST ----- Can refill provera for 1 month but patient needs to make f/u appt to discuss long term management of bleeding.

## 2018-03-02 ENCOUNTER — Ambulatory Visit: Payer: Self-pay | Admitting: Nurse Practitioner

## 2018-03-23 ENCOUNTER — Encounter: Payer: Self-pay | Admitting: Nurse Practitioner

## 2018-03-23 ENCOUNTER — Ambulatory Visit: Payer: Self-pay | Attending: Nurse Practitioner | Admitting: Nurse Practitioner

## 2018-03-23 VITALS — BP 123/86 | HR 88 | Temp 98.8°F | Ht 65.0 in | Wt 192.8 lb

## 2018-03-23 DIAGNOSIS — D508 Other iron deficiency anemias: Secondary | ICD-10-CM

## 2018-03-23 DIAGNOSIS — D492 Neoplasm of unspecified behavior of bone, soft tissue, and skin: Secondary | ICD-10-CM

## 2018-03-23 MED ORDER — FERROUS SULFATE 325 (65 FE) MG PO TABS: 325.0000 mg | ORAL_TABLET | Freq: Every day | ORAL | 1 refills | Status: DC

## 2018-03-23 NOTE — Progress Notes (Signed)
Assessment & Plan:  Ashley Singleton was seen today for follow-up.  Diagnoses and all orders for this visit:  Other iron deficiency anemia -     CBC -     ferrous sulfate 325 (65 FE) MG tablet; Take 1 tablet (325 mg total) by mouth daily with breakfast.  Growth of ear canal -     Ambulatory referral to ENT    Patient has been counseled on age-appropriate routine health concerns for screening and prevention. These are reviewed and up-to-date. Referrals have been placed accordingly. Immunizations are up-to-date or declined.    Subjective:   Chief Complaint  Patient presents with  . Follow-up    Pt. is here for follow-up on anemia. Pt. would like PCP to look at her left ear.    HPI Ashley Singleton 48 y.o. female presents to office today for follow up to anemia. She also has a complaint of ear problem today.    Anemia Chronic and stable. Currently taking ferrous sulfate 358m daily as prescribed. She denies any shortness of breath, chest pain, lightheadedness, dizziness, headaches or palpitations.  Lab Results  Component Value Date   WBC 4.0 08/29/2017   HGB 11.9 08/29/2017   HCT 35.8 08/29/2017   MCV 87 08/29/2017   PLT 337 08/29/2017    Ear Problem She has complaints of a growth in her left ear. Onset a few months ago. States she went to an urgent care a week or so ago and was given an antibiotic to take by mouth and was instructed to follow up with an ear specialist to have it surgically removed. She is here today with the same concern. No loss of hearing or purulent drainage. The growth is only painful with moderate manipulation.      Review of Systems  Constitutional: Negative for fever, malaise/fatigue and weight loss.  HENT: Negative for ear discharge, ear pain, hearing loss, nosebleeds, sore throat and tinnitus.        SEE HPI  Eyes: Negative.  Negative for blurred vision, double vision and photophobia.  Respiratory: Negative.  Negative for cough and shortness of breath.     Cardiovascular: Negative.  Negative for chest pain, palpitations and leg swelling.  Gastrointestinal: Negative.  Negative for heartburn, nausea and vomiting.  Musculoskeletal: Negative.  Negative for myalgias.  Neurological: Negative.  Negative for dizziness, focal weakness, seizures and headaches.  Psychiatric/Behavioral: Negative.  Negative for suicidal ideas.    Past Medical History:  Diagnosis Date  . Allergy   . Anemia   . Fibroids     Past Surgical History:  Procedure Laterality Date  . COLONOSCOPY     two   . ESOPHAGOGASTRODUODENOSCOPY N/A 01/09/2017   Procedure: ESOPHAGOGASTRODUODENOSCOPY (EGD);  Surgeon: DDoran Stabler MD;  [facility];  Service: Endoscopy;  Laterality: N/A;  . TONSILLECTOMY      Family History  Problem Relation Age of Onset  . Diabetes Mother   . Multiple myeloma Maternal Grandmother   . Prostate cancer Maternal Grandfather   . Pancreatic cancer Paternal Grandfather   . Colon cancer Neg Hx   . Esophageal cancer Neg Hx   . Rectal cancer Neg Hx   . Stomach cancer Neg Hx     Social History Reviewed with no changes to be made today.   Outpatient Medications Prior to Visit  Medication Sig Dispense Refill  . ferrous sulfate 325 (65 FE) MG tablet Take 1 tablet (325 mg total) by mouth daily with breakfast. 30 tablet  1  . medroxyPROGESTERone (PROVERA) 10 MG tablet Take 1 tablet (10 mg total) by mouth daily. (Patient not taking: Reported on 03/23/2018) 30 tablet 0   No facility-administered medications prior to visit.     Allergies  Allergen Reactions  . Shrimp [Shellfish Allergy] Itching       Objective:    BP 123/86 (BP Location: Left Arm, Patient Position: Sitting, Cuff Size: Normal)   Pulse 88   Temp 98.8 F (37.1 C) (Oral)   Ht _0  (1.651 m)   Wt 192 lb 12.8 oz (87.5 kg)   LMP 03/02/2018   SpO2 97%   BMI 32.08 kg/m  Wt Readings from Last 3 Encounters:  03/23/18 192 lb 12.8 oz (87.5 kg)  08/29/17 185 lb 3.2 oz (84  kg)  06/21/17 186 lb 6.4 oz (84.6 kg)    Physical Exam Vitals signs and nursing note reviewed.  Constitutional:      Appearance: She is well-developed.  HENT:     Head: Normocephalic and atraumatic.     Right Ear: Hearing, tympanic membrane, ear canal and external ear normal.     Left Ear: Hearing normal.  Neck:     Musculoskeletal: Normal range of motion.  Cardiovascular:     Rate and Rhythm: Normal rate and regular rhythm.     Heart sounds: Normal heart sounds. No murmur. No friction rub. No gallop.   Pulmonary:     Effort: Pulmonary effort is normal. No tachypnea or respiratory distress.     Breath sounds: Normal breath sounds. No decreased breath sounds, wheezing, rhonchi or rales.  Chest:     Chest wall: No tenderness.  Abdominal:     General: Bowel sounds are normal.     Palpations: Abdomen is soft.  Musculoskeletal: Normal range of motion.  Skin:    General: Skin is warm and dry.  Neurological:     Mental Status: She is alert and oriented to person, place, and time.     Coordination: Coordination normal.  Psychiatric:        Behavior: Behavior normal. Behavior is cooperative.        Thought Content: Thought content normal.        Judgment: Judgment normal.          Patient has been counseled extensively about nutrition and exercise as well as the importance of adherence with medications and regular follow-up. The patient was given clear instructions to go to ER or return to medical center if symptoms don't improve, worsen or new problems develop. The patient verbalized understanding.   Follow-up: Return in about 2 months (around 05/23/2018) for ear growth and needs FA forms today.[name], FNP-BC [facility] and [facility] [place], [place]   03/23/2018, 7:56 PM

## 2018-03-24 LAB — CBC
Hematocrit: 38.9 % (ref 34.0–46.6)
Hemoglobin: 12.9 g/dL (ref 11.1–15.9)
MCH: 28.7 pg (ref 26.6–33.0)
MCHC: 33.2 g/dL (ref 31.5–35.7)
MCV: 86 fL (ref 79–97)
PLATELETS: 321 10*3/uL (ref 150–450)
RBC: 4.5 x10E6/uL (ref 3.77–5.28)
RDW: 12.8 % (ref 11.7–15.4)
WBC: 5.7 10*3/uL (ref 3.4–10.8)

## 2018-03-26 ENCOUNTER — Telehealth: Payer: Self-pay

## 2018-03-26 NOTE — Telephone Encounter (Signed)
-----   Message from [name], NP sent at 03/25/2018  9:27 PM EDT ----- Labs do not show anemia.

## 2018-03-26 NOTE — Telephone Encounter (Signed)
Patient called back to get their results please follow up

## 2018-03-26 NOTE — Telephone Encounter (Signed)
CMA attempt to reach patient to inform on results.  No answer and left a VM for call back.

## 2018-03-26 NOTE — Telephone Encounter (Signed)
CMA attempt to reach patient to inform on results.  No answer and left a VM.  

## 2018-03-30 NOTE — Telephone Encounter (Signed)
Patient returned call.  Pt. Was inform on lab results.  Pt. Understood.

## 2018-06-25 ENCOUNTER — Ambulatory Visit: Payer: Self-pay | Admitting: Nurse Practitioner

## 2018-06-26 ENCOUNTER — Ambulatory Visit: Payer: Self-pay | Admitting: Nurse Practitioner

## 2018-07-17 ENCOUNTER — Ambulatory Visit: Payer: Self-pay | Admitting: Nurse Practitioner

## 2018-07-31 ENCOUNTER — Ambulatory Visit: Payer: Self-pay | Admitting: Nurse Practitioner

## 2018-09-23 ENCOUNTER — Other Ambulatory Visit: Payer: Self-pay | Admitting: Nurse Practitioner

## 2018-09-23 DIAGNOSIS — D508 Other iron deficiency anemias: Secondary | ICD-10-CM

## 2019-02-08 IMAGING — US US PELVIS COMPLETE
1 series · 15 of 25 positions shown · non-contrast
Comparison: 10/29/2007

CLINICAL DATA: History of uterine fibroids.

EXAM:
TRANSABDOMINAL AND TRANSVAGINAL ULTRASOUND OF PELVIS
TECHNIQUE: Both transabdominal and transvaginal ultrasound examinations of the
pelvis were performed. Transabdominal technique was performed for
global imaging of the pelvis including uterus, ovaries, adnexal
regions, and pelvic cul-de-sac. It was necessary to proceed with
endovaginal exam following the transabdominal exam to visualize the
uterus and ovaries..

[Series 1: us pelvis complete · 83 acquisitions, 15 frames shown]
[im 1/83]
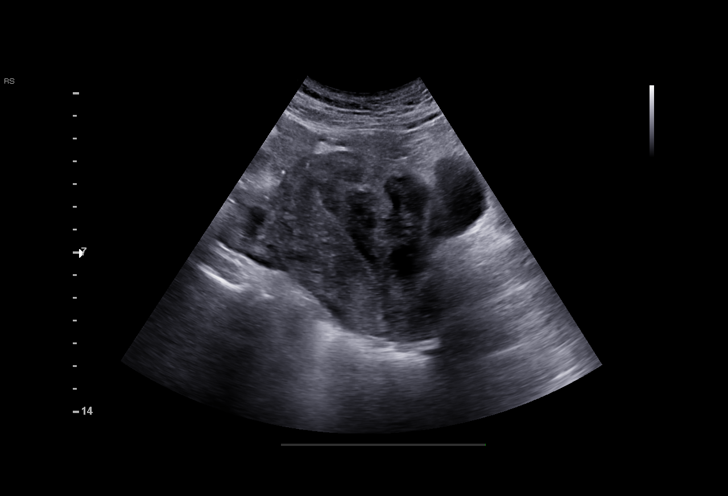
[im 7/83]
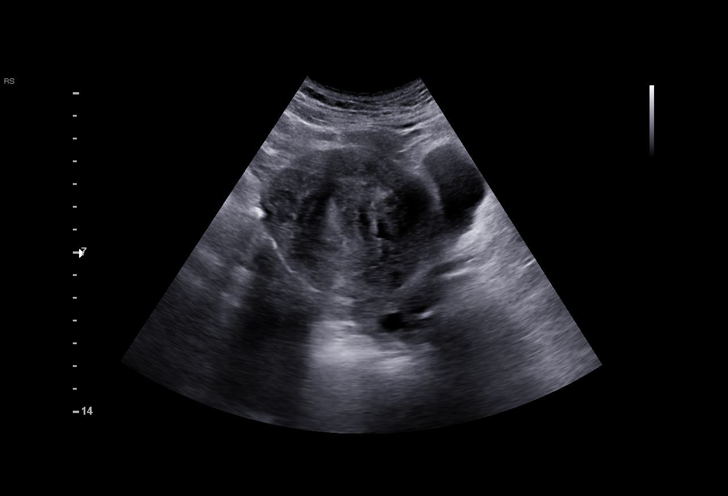
[im 14/83]
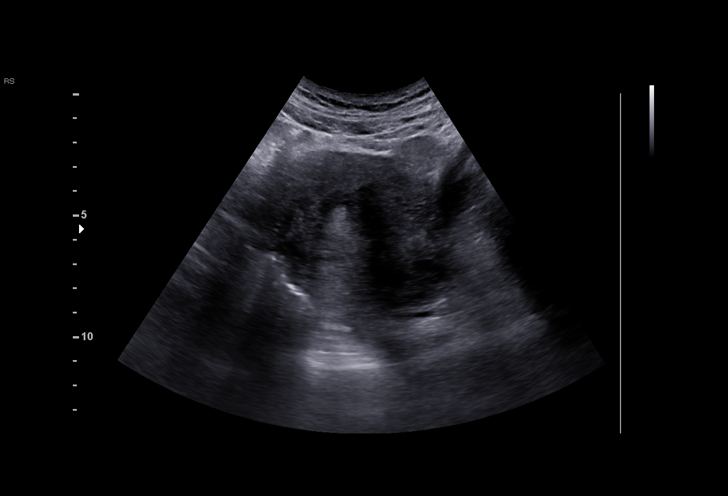
[im 18/83]
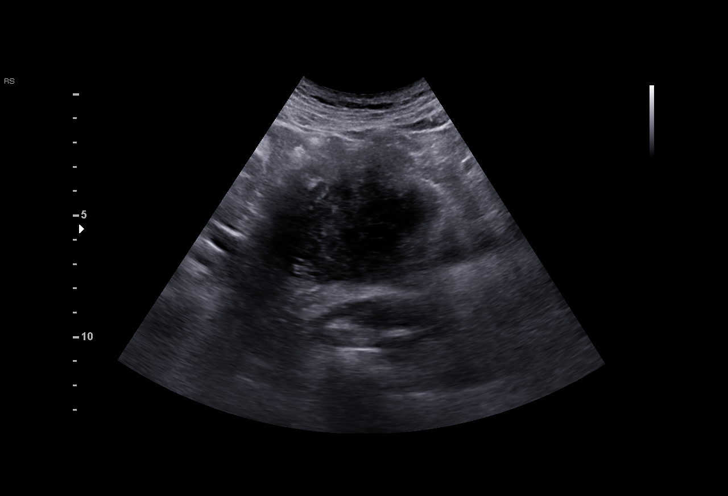
[im 24/83]
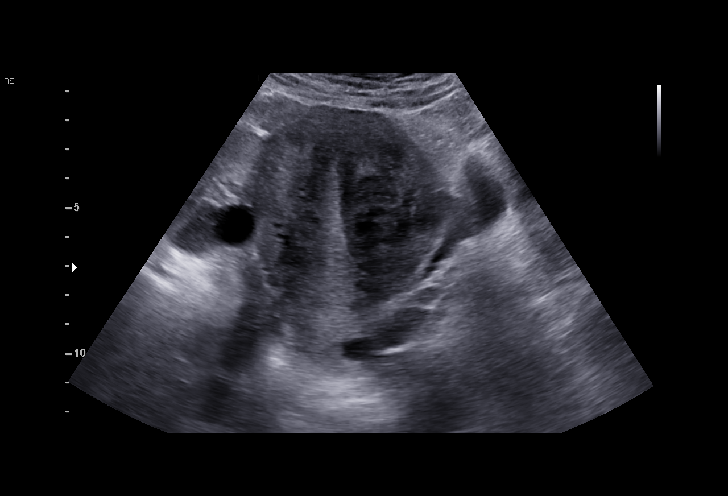
[im 31/83]
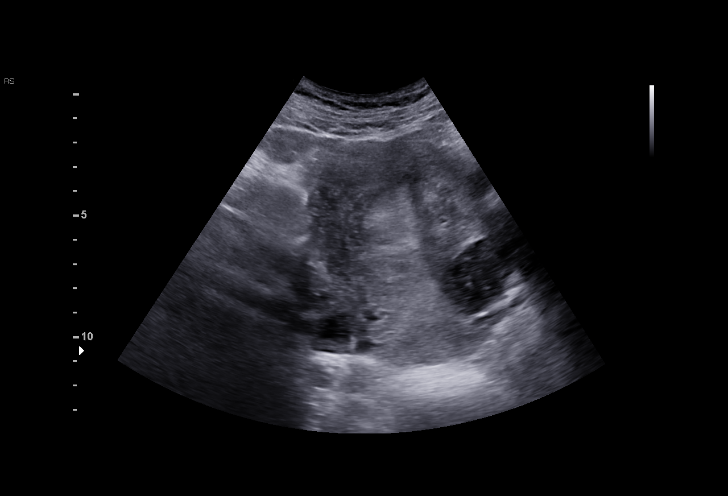
[im 35/83]
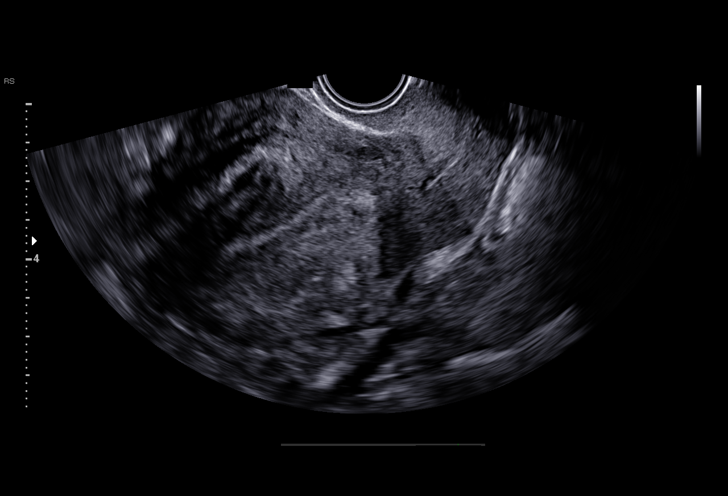
[im 42/83]
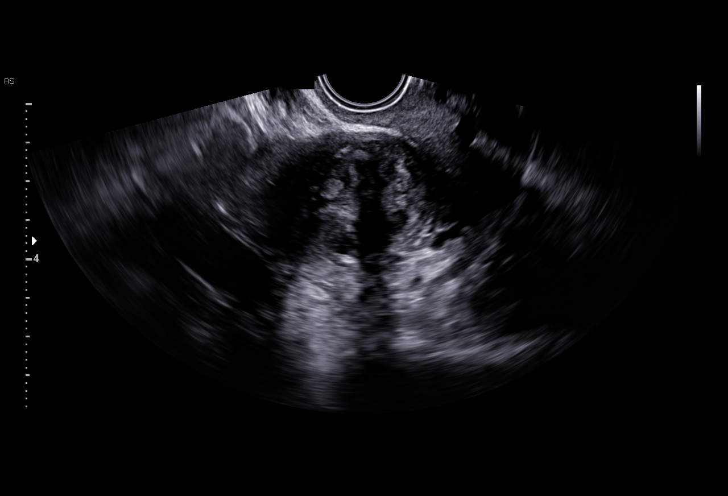
[im 48/83]
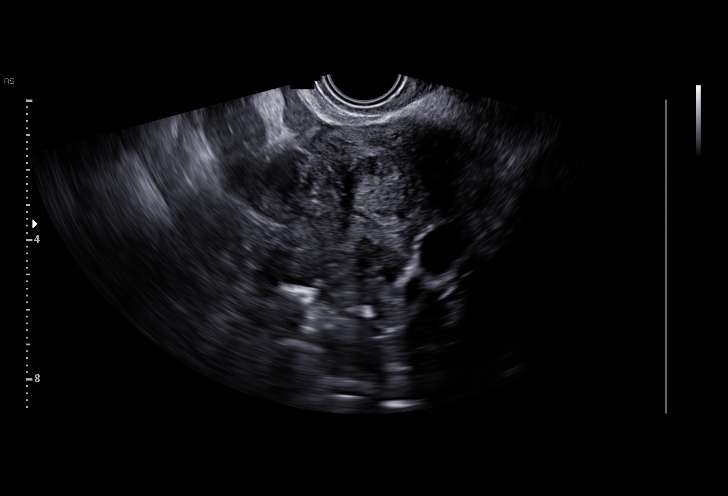
[im 52/83]
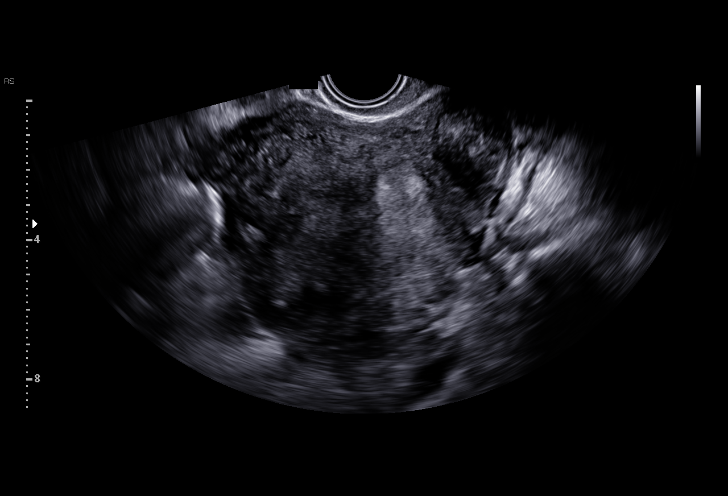
[im 59/83]
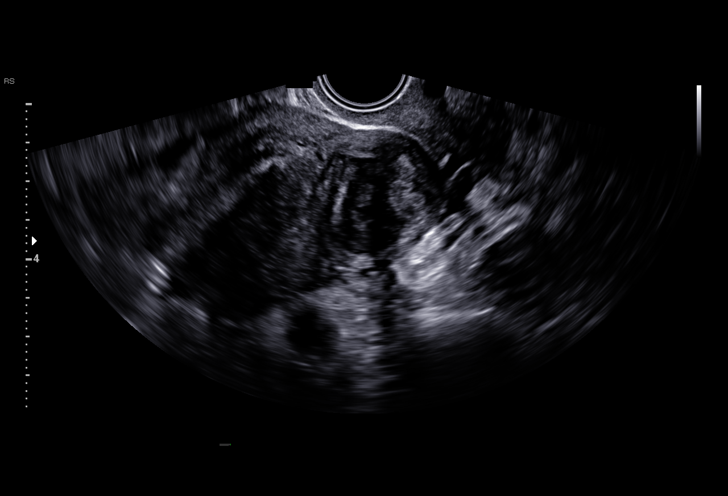
[im 65/83]
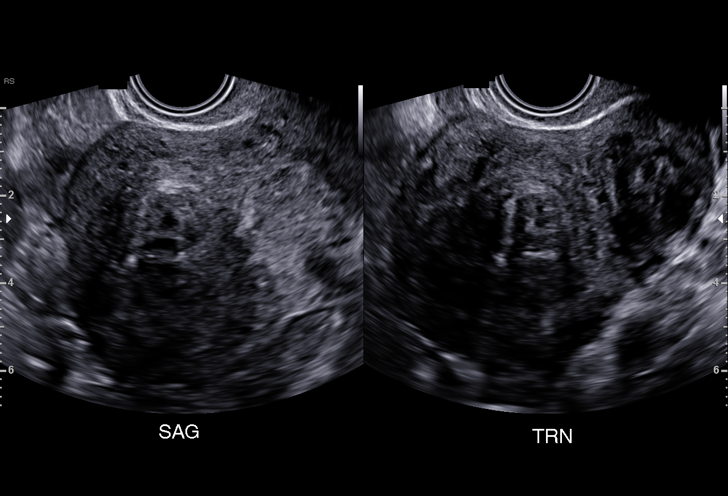
[im 69/83]
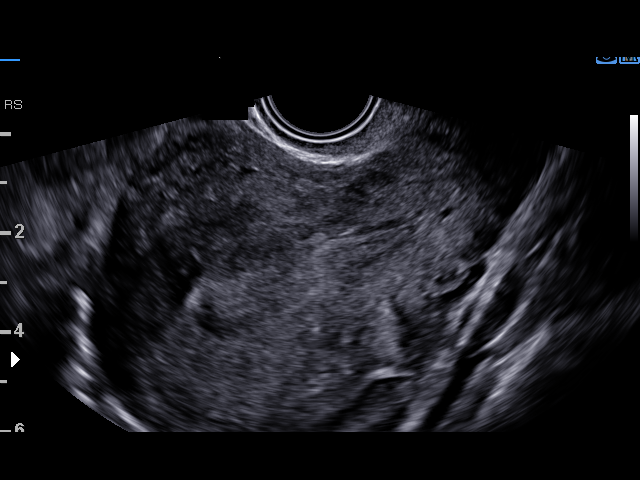
[im 76/83]
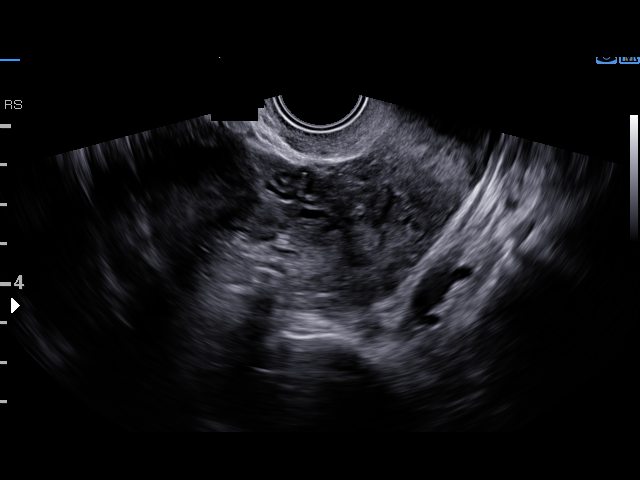
[im 83/83]
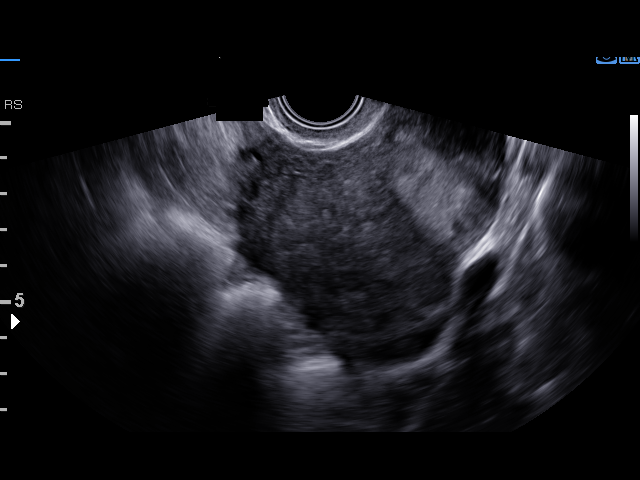

[15 of 25 positions shown; findings below may reference images not displayed]

FINDINGS: Uterus

Measurements: 10.3 x 6.3 x 7.7 cm (volume = 260 cm^3). On the
previous exam the uterus measured 21 x 9.6 x 11.8 cm (volume = 3855
cm^3). There are 3 fibroids identified within the anterior and
left side of the uterus. The largest is in the lower uterine segment
measuring 3.2 x 3.2 x 2.8 cm. Anterior submucosal fibroid measures
2.5 x 1.9 x 1.9 cm. Anterior subserosal fibroid measures 2.6 x 2.7 x
2.3 cm.

Endometrium

Thickness: 16 mm.  Appears heterogeneous.

Right ovary

Measurements: 3.5 x 2.2 x 2.6 cm. Normal appearance/no adnexal mass.

Left ovary

Measurements: 3.2 x 1.5 x 1.9 cm. Within the left adnexa there is a
tubular anechoic structure which may represent hydrosalpinx..

Other findings

Trace free fluid within the pelvis.
IMPRESSION: 1. Fibroid uterus with a volume of approximately 260 cc. This is
decreased in volume when compared with 12/25/2008 when it measured
approximately 3855 cc.
2. The endometrium is mildly measuring 16 mm. If bleeding remains
unresponsive to hormonal or medical therapy, focal lesion work-up
with sonohysterogram should be considered. Endometrial biopsy should
also be considered in pre-menopausal patients at high risk for
endometrial carcinoma. (Ref: Radiological Reasoning: Algorithmic
Workup of Abnormal Vaginal Bleeding with Endovaginal Sonography and
Sonohysterography. AJR [id number]; 191:[id number])
3. There is an anterior submucosal fibroid which measures 2.5 cm
which may contribute to the patient's menorrhagia.
4. Anechoic tubular structure within the left adnexa may represent
hydrosalpinx.
